# Patient Record
Sex: Female | Born: 1985 | Race: White | Hispanic: No | Marital: Married | Smoking: Never smoker
Health system: Southern US, Community
[De-identification: ages and names within clinical notes are randomized; demographics above are authoritative.]

## PROBLEM LIST (undated history)

## (undated) DIAGNOSIS — Z789 Other specified health status: Secondary | ICD-10-CM

## (undated) HISTORY — PX: SEPTOPLASTY: SUR1290

## (undated) HISTORY — PX: NO PAST SURGERIES: SHX2092

---

## 2019-02-27 DIAGNOSIS — Z01419 Encounter for gynecological examination (general) (routine) without abnormal findings: Secondary | ICD-10-CM | POA: Diagnosis not present

## 2019-02-27 DIAGNOSIS — F419 Anxiety disorder, unspecified: Secondary | ICD-10-CM | POA: Diagnosis not present

## 2019-02-27 DIAGNOSIS — Z0000 Encounter for general adult medical examination without abnormal findings: Secondary | ICD-10-CM | POA: Diagnosis not present

## 2019-02-27 DIAGNOSIS — F329 Major depressive disorder, single episode, unspecified: Secondary | ICD-10-CM | POA: Diagnosis not present

## 2019-04-23 ENCOUNTER — Emergency Department (HOSPITAL_COMMUNITY)
Admission: EM | Admit: 2019-04-23 | Discharge: 2019-04-23 | Disposition: A | Discharge status: Home / Self Care | Payer: 59 | Attending: Emergency Medicine | Admitting: Emergency Medicine

## 2019-04-23 ENCOUNTER — Other Ambulatory Visit: Payer: Self-pay

## 2019-04-23 ENCOUNTER — Emergency Department (HOSPITAL_COMMUNITY): Payer: 59

## 2019-04-23 ENCOUNTER — Encounter (HOSPITAL_COMMUNITY): Payer: Self-pay

## 2019-04-23 DIAGNOSIS — J189 Pneumonia, unspecified organism: Secondary | ICD-10-CM | POA: Insufficient documentation

## 2019-04-23 DIAGNOSIS — N3091 Cystitis, unspecified with hematuria: Secondary | ICD-10-CM | POA: Diagnosis not present

## 2019-04-23 DIAGNOSIS — R918 Other nonspecific abnormal finding of lung field: Secondary | ICD-10-CM | POA: Diagnosis not present

## 2019-04-23 DIAGNOSIS — Z20828 Contact with and (suspected) exposure to other viral communicable diseases: Secondary | ICD-10-CM | POA: Diagnosis not present

## 2019-04-23 DIAGNOSIS — R1111 Vomiting without nausea: Secondary | ICD-10-CM | POA: Diagnosis not present

## 2019-04-23 DIAGNOSIS — N3090 Cystitis, unspecified without hematuria: Secondary | ICD-10-CM | POA: Diagnosis not present

## 2019-04-23 DIAGNOSIS — R319 Hematuria, unspecified: Secondary | ICD-10-CM | POA: Diagnosis present

## 2019-04-23 LAB — URINALYSIS, ROUTINE W REFLEX MICROSCOPIC
Bilirubin Urine: NEGATIVE
Glucose, UA: NEGATIVE mg/dL
Hgb urine dipstick: NEGATIVE
Ketones, ur: 20 mg/dL — AB
Leukocytes,Ua: NEGATIVE
Nitrite: POSITIVE — AB
Protein, ur: NEGATIVE mg/dL
Specific Gravity, Urine: 1.014 (ref 1.005–1.030)
pH: 7 (ref 5.0–8.0)

## 2019-04-23 LAB — PREGNANCY, URINE: Preg Test, Ur: NEGATIVE

## 2019-04-23 MED ORDER — CEFDINIR 300 MG PO CAPS: 300.0000 mg | ORAL_CAPSULE | Freq: Two times a day (BID) | ORAL | 0 refills | Status: AC

## 2019-04-23 NOTE — Discharge Instructions (Addendum)
Take the prescription as directed.  Call your regular medical doctor today to schedule a follow up appointment within the next week.  Return to the Emergency Department immediately sooner if worsening.

## 2019-04-23 NOTE — ED Provider Notes (Signed)
[facility] EMERGENCY DEPARTMENT Provider Note   CSN: [id number] Arrival date & time: 04/23/19  6160     History   Chief Complaint Chief Complaint  Patient presents with   Hematuria    HPI Veronica Harris is a 33 y.o. female.     HPI  Pt was seen at 1025.  Per pt, c/o gradual onset and persistence of constant dysuria and hematuria for the past 2 days. Has been associated with suprapubic "pressure," several episodes of N/V, and bilat lower back "pains."  Denies fevers, no abd pain, no N/V/D, no vaginal bleeding/discharge, no rash.    History reviewed. No pertinent past medical history.  There are no active problems to display for this patient.   History reviewed. No pertinent surgical history.   OB History   No obstetric history on file.      Home Medications    Prior to Admission medications   Not on File    Family History No family history on file.  Social History Social History   Tobacco Use   Smoking status: Never Smoker   Smokeless tobacco: Never Used  Substance Use Topics   Alcohol use: Never    Frequency: Never   Drug use: Never     Allergies   Latex   Review of Systems Review of Systems ROS: Statement: All systems negative except as marked or noted in the HPI; Constitutional: Negative for fever and chills. ; ; Eyes: Negative for eye pain, redness and discharge. ; ; ENMT: Negative for ear pain, hoarseness, nasal congestion, sinus pressure and sore throat. ; ; Cardiovascular: Negative for chest pain, palpitations, diaphoresis, dyspnea and peripheral edema. ; ; Respiratory: Negative for cough, wheezing and stridor. ; ; Gastrointestinal: Negative for nausea, vomiting, diarrhea, abdominal pain, blood in stool, hematemesis, jaundice and rectal bleeding. . ; ; Genitourinary: +dysuria and hematuria. ; ; GYN:  No pelvic pain, no vaginal bleeding, no vaginal discharge, no vulvar pain. ;; Musculoskeletal: +LBP. Negative for neck pain. Negative for  swelling and trauma.; ; Skin: Negative for pruritus, rash, abrasions, blisters, bruising and skin lesion.; ; Neuro: Negative for headache, lightheadedness and neck stiffness. Negative for weakness, altered level of consciousness, altered mental status, extremity weakness, paresthesias, involuntary movement, seizure and syncope.       Physical Exam Updated Vital Signs BP (!) 119/92    Pulse (!) 102    Temp 97.8 F (36.6 C)    Resp 18    LMP 04/12/2019    SpO2 100%   Physical Exam 1030: Physical examination:  Nursing notes reviewed; Vital signs and O2 SAT reviewed;  Constitutional: Well developed, Well nourished, Well hydrated, In no acute distress; Head:  Normocephalic, atraumatic; Eyes: EOMI, PERRL, No scleral icterus; ENMT: Mouth and pharynx normal, Mucous membranes moist; Neck: Supple, Full range of motion, No lymphadenopathy; Cardiovascular: Regular rate and rhythm, No gallop; Respiratory: Breath sounds clear & equal bilaterally, No wheezes.  Speaking full sentences with ease, Normal respiratory effort/excursion; Chest: Nontender, Movement normal; Abdomen: Soft, +mild suprapubic tenderness to palp. No rebound or guarding. Nondistended, Normal bowel sounds; Genitourinary: No CVA tenderness; Spine:  No midline CS, TS, LS tenderness. +mild TTP bilat lumbar paraspinal muscles. No rash.;; Extremities: Peripheral pulses normal, No tenderness, No edema, No calf edema or asymmetry.; Neuro: AA&Ox3, Major CN grossly intact.  Speech clear. No gross focal motor or sensory deficits in extremities.; Skin: Color normal, Warm, Dry.   ED Treatments / Results  Labs (all labs ordered are listed, but  only abnormal results are displayed)   EKG None  Radiology   Procedures Procedures (including critical care time)  Medications Ordered in ED Medications - No data to display   Initial Impression / Assessment and Plan / ED Course  I have reviewed the triage vital signs and the nursing notes.  Pertinent  labs & imaging results that were available during my care of the patient were reviewed by me and considered in my medical decision making (see chart for details).       MDM Reviewed: previous chart, nursing note and vitals Interpretation: labs and CT scan   Results for orders placed or performed during the hospital encounter of 04/23/19  Urinalysis, Routine w reflex microscopic  Result Value Ref Range   Color, Urine AMBER (A) YELLOW   APPearance CLEAR CLEAR   Specific Gravity, Urine 1.014 1.005 - 1.030   pH 7.0 5.0 - 8.0   Glucose, UA NEGATIVE NEGATIVE mg/dL   Hgb urine dipstick NEGATIVE NEGATIVE   Bilirubin Urine NEGATIVE NEGATIVE   Ketones, ur 20 (A) NEGATIVE mg/dL   Protein, ur NEGATIVE NEGATIVE mg/dL   Nitrite POSITIVE (A) NEGATIVE   Leukocytes,Ua NEGATIVE NEGATIVE   RBC / HPF 0-5 0 - 5 RBC/hpf   WBC, UA 0-5 0 - 5 WBC/hpf   Bacteria, UA RARE (A) NONE SEEN   Squamous Epithelial / LPF 0-5 0 - 5   Mucus PRESENT   Pregnancy, urine  Result Value Ref Range   Preg Test, Ur NEGATIVE NEGATIVE   Dg Chest 2 View Result Date: 04/23/2019 CLINICAL DATA:  Abnormality discovered on CT of the abdomen and pelvis EXAM: CHEST - 2 VIEW COMPARISON:  CT abdomen and pelvis acquired the same date. FINDINGS: Left upper lobe opacity best seen on the frontal view with partial silhouetting on the lateral view of the cardiac borders. No signs of pleural effusion. No additional upper lobe abnormality by radiography. Heart size is normal. No acute bone finding. IMPRESSION: Findings that likely represent pneumonitis/pneumonia. Consider follow-up to ensure resolution and exclude underlying lesion. Electronically Signed   By: Zetta Bills M.D.   On: 04/23/2019 12:57   Ct Renal Stone Study Result Date: 04/23/2019 CLINICAL DATA:  Bilateral flank pain worse on the left side since yesterday with nausea, vomiting and hematuria. History of renal stones. EXAM: CT ABDOMEN AND PELVIS WITHOUT CONTRAST TECHNIQUE:  Multidetector CT imaging of the abdomen and pelvis was performed following the standard protocol without IV contrast. COMPARISON:  None. FINDINGS: Lower chest: Lung bases demonstrate airspace consolidation over the lateral lingula incompletely visualized, but likely a pneumonia. No effusion. Hepatobiliary: Liver, gallbladder and biliary tree are normal. Pancreas: Normal. Spleen: Normal. Adrenals/Urinary Tract: Adrenal glands are normal. Kidneys are normal in size with 2 mm stone over the lower pole right kidney and possible punctate stone over the upper pole left kidney. No evidence of hydronephrosis. Ureters and bladder are normal. Stomach/Bowel: Stomach and small bowel are normal. Appendix is normal. Colon is unremarkable. Vascular/Lymphatic: Abdominal aorta is normal in caliber. No adenopathy. Reproductive: Normal. Other: Tiny amount of free pelvic fluid likely physiologic. Musculoskeletal: Normal. IMPRESSION: 1. No acute findings in the abdomen/pelvis. Minimal bilateral nephrolithiasis without obstruction. 2. Incompletely visualized airspace consolidation over the left lateral aspect of the lingula likely a pneumonia. Recommend correlation with chest radiograph. Electronically Signed   By: Marin Olp M.D.   On: 04/23/2019 12:01     KASSIDEE NARCISO was evaluated in Emergency Department on 04/23/2019 for the symptoms described in  the history of present illness. She was evaluated in the context of the global COVID-19 pandemic, which necessitated consideration that the patient might be at risk for infection with the SARS-CoV-2 virus that causes COVID-19. Institutional protocols and algorithms that pertain to the evaluation of patients at risk for COVID-19 are in a state of rapid change based on information released by regulatory bodies including the CDC and federal and state organizations. These policies and algorithms were followed during the patient's care in the ED.   1400:  Tx for UTI and CAP. Dx and  testing, as well as incidental finding(s), d/w pt and family.  Questions answered.  Verb understanding, agreeable to d/c home with outpt f/u.    Final Clinical Impressions(s) / ED Diagnoses   Final diagnoses:  None    ED Discharge Orders    None       Samuel JesterMcManus, Katricia Prehn, DO 04/26/19 [id number]

## 2019-04-23 NOTE — ED Notes (Signed)
EDP at bedside updating pt 

## 2019-04-23 NOTE — ED Triage Notes (Signed)
Pt reports hematuria, n/v, pressure in bladder, and bilateral flank pain.

## 2019-04-23 NOTE — ED Notes (Signed)
Patient transported to CT 

## 2019-04-24 LAB — NOVEL CORONAVIRUS, NAA (HOSP ORDER, SEND-OUT TO REF LAB; TAT 18-24 HRS): SARS-CoV-2, NAA: NOT DETECTED

## 2019-05-02 ENCOUNTER — Other Ambulatory Visit (HOSPITAL_COMMUNITY): Payer: Self-pay | Admitting: Nurse Practitioner

## 2019-05-30 DIAGNOSIS — N6320 Unspecified lump in the left breast, unspecified quadrant: Secondary | ICD-10-CM | POA: Diagnosis not present

## 2019-06-12 DIAGNOSIS — N6321 Unspecified lump in the left breast, upper outer quadrant: Secondary | ICD-10-CM | POA: Diagnosis not present

## 2019-07-16 MED FILL — ESCITALOPRAM 20 MG TABLET: 20 | 90 days supply | Qty: 90 | Fill #0

## 2019-07-16 MED FILL — buPROPion HCL ER (XL) 300 M: 300 | 90 days supply | Qty: 90 | Fill #0

## 2019-08-20 DIAGNOSIS — S161XXA Strain of muscle, fascia and tendon at neck level, initial encounter: Secondary | ICD-10-CM | POA: Diagnosis not present

## 2019-08-20 DIAGNOSIS — Z91040 Latex allergy status: Secondary | ICD-10-CM | POA: Diagnosis not present

## 2019-08-20 DIAGNOSIS — X58XXXA Exposure to other specified factors, initial encounter: Secondary | ICD-10-CM | POA: Diagnosis not present

## 2019-08-20 DIAGNOSIS — Z79899 Other long term (current) drug therapy: Secondary | ICD-10-CM | POA: Diagnosis not present

## 2019-08-20 DIAGNOSIS — Z888 Allergy status to other drugs, medicaments and biological substances status: Secondary | ICD-10-CM | POA: Diagnosis not present

## 2020-03-31 ENCOUNTER — Other Ambulatory Visit (HOSPITAL_COMMUNITY): Payer: Self-pay | Admitting: Nurse Practitioner

## 2021-01-06 IMAGING — DX DG CHEST 2V
2 series · 2 of 2 positions shown · non-contrast
Comparison: CT abdomen and pelvis acquired the same date.

CLINICAL DATA: Abnormality discovered on CT of the abdomen and
pelvis

EXAM:
CHEST - 2 VIEW

[chest pa]
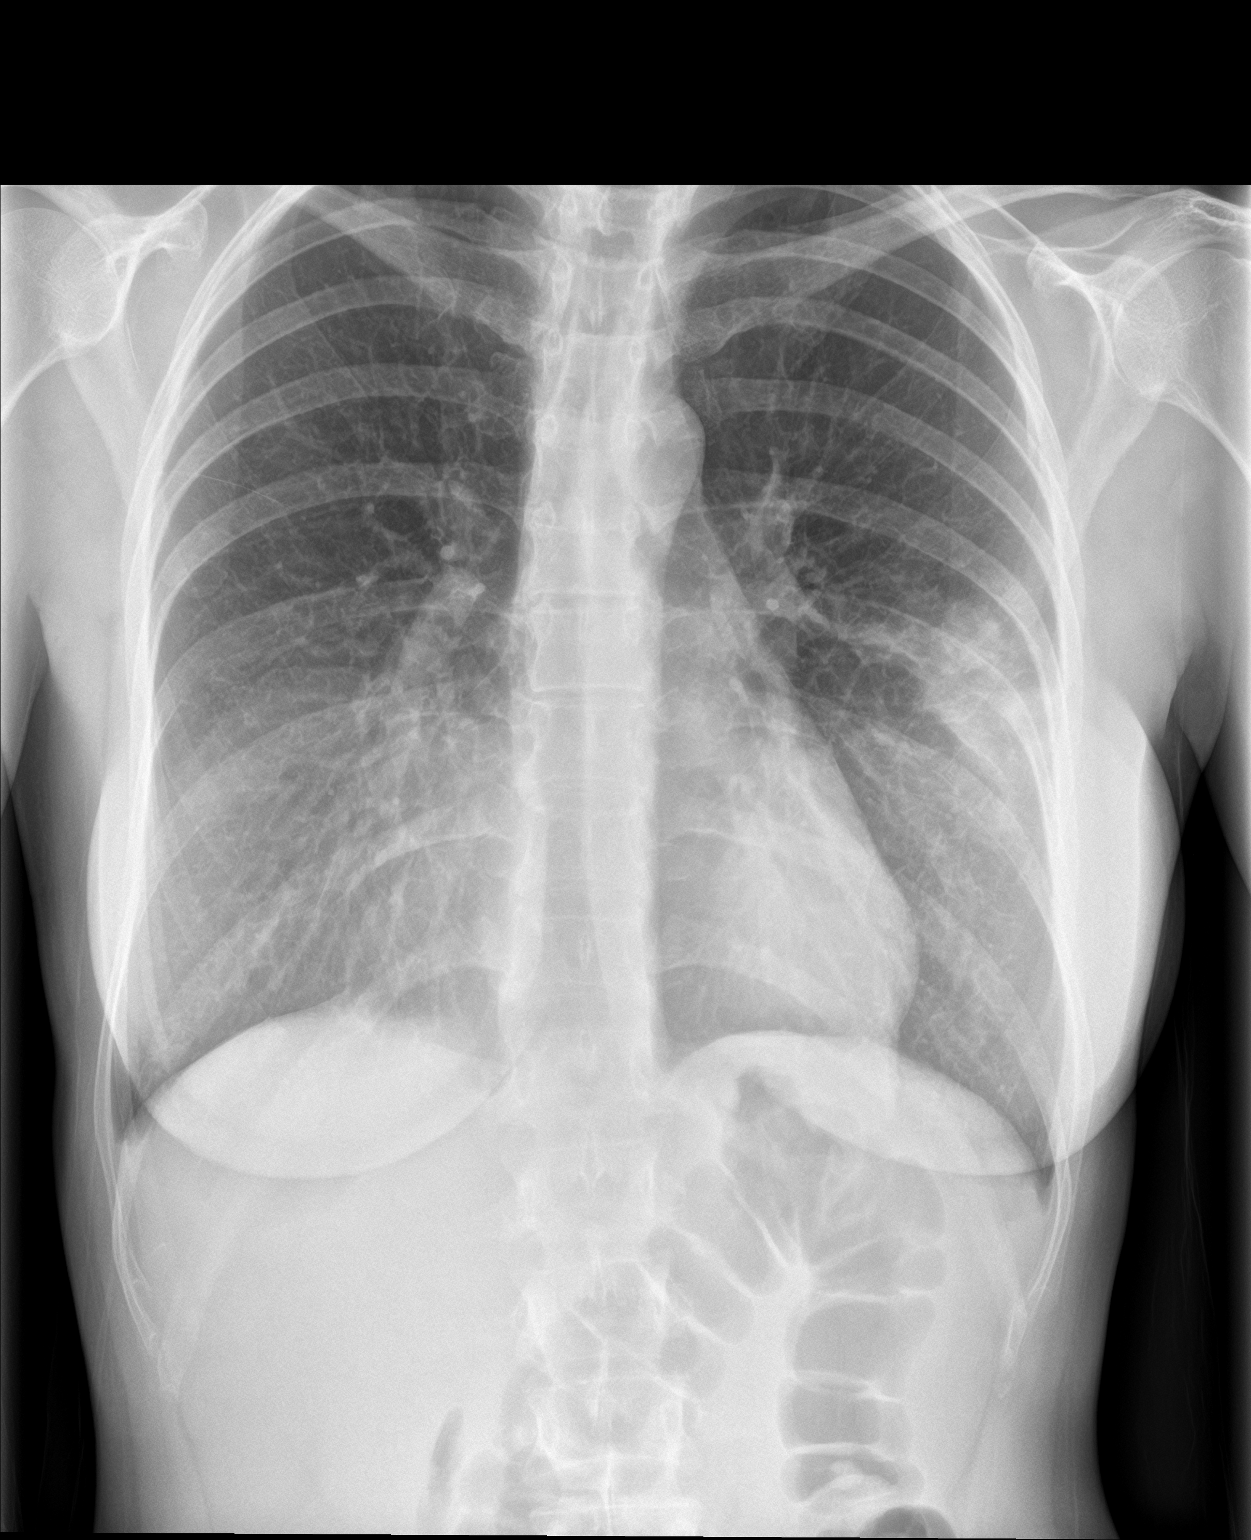

[chest lat]
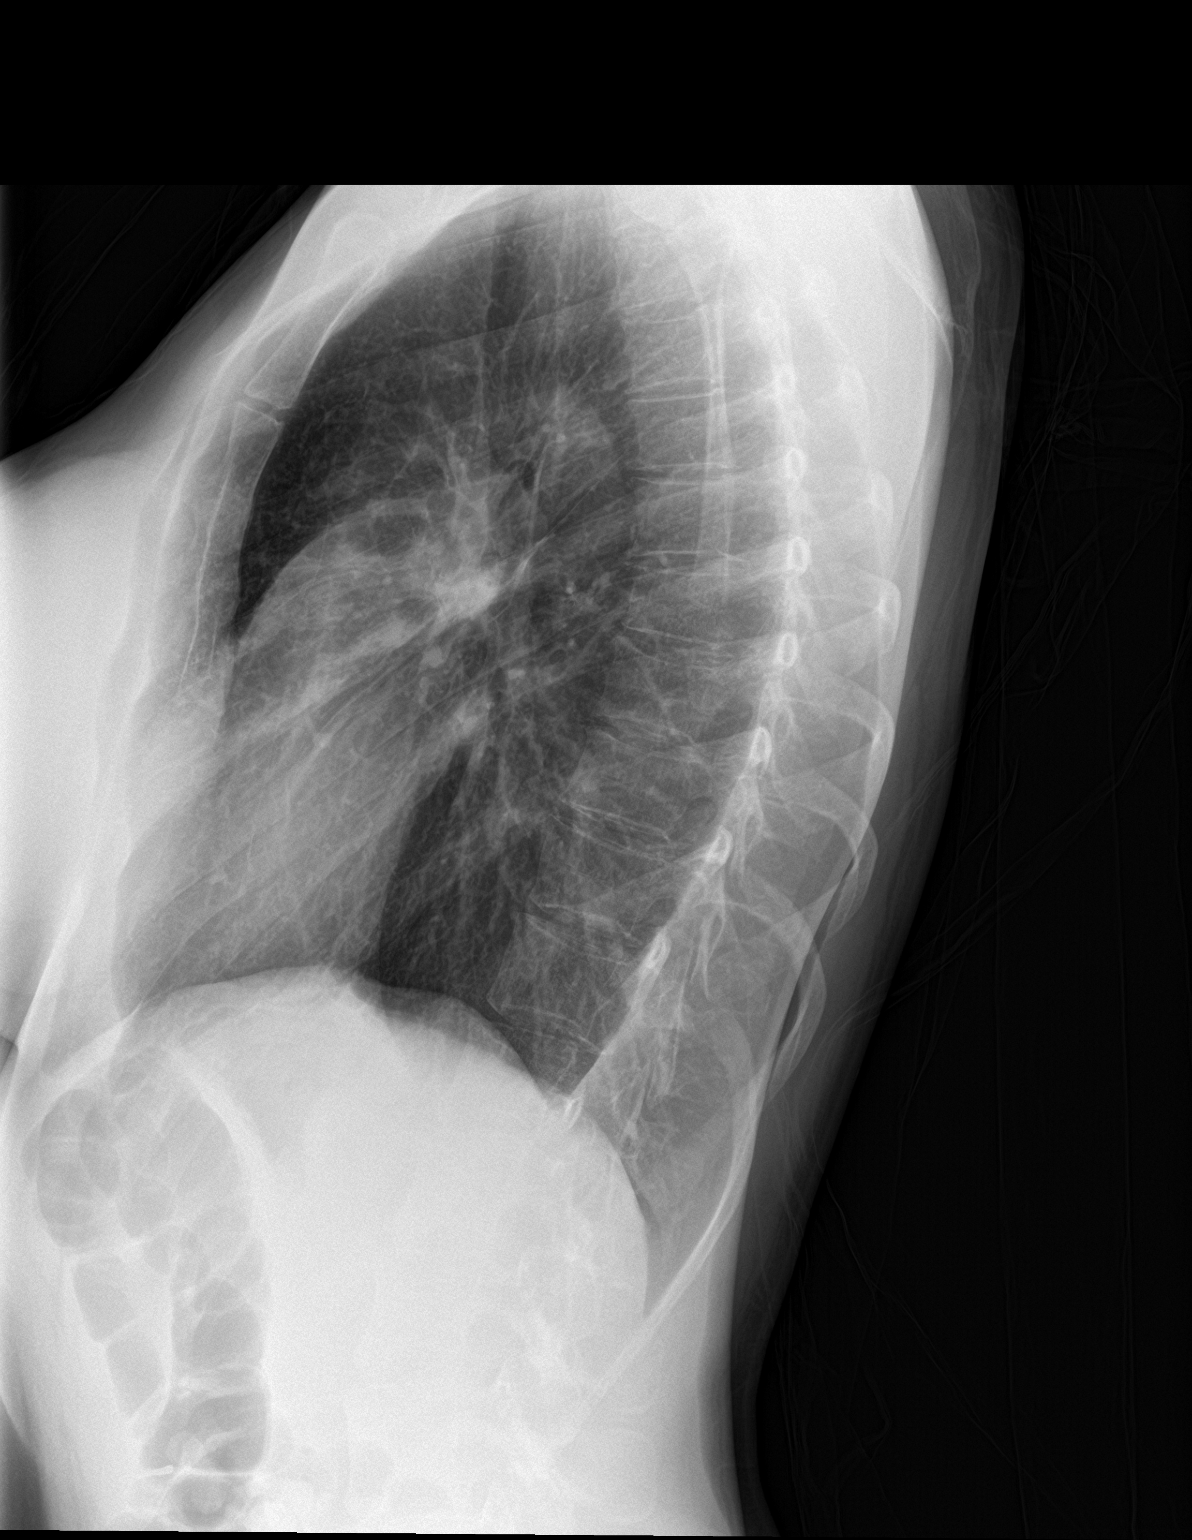

[2 of 2 positions shown; findings below may reference images not displayed]

FINDINGS: Left upper lobe opacity best seen on the frontal view with partial
silhouetting on the lateral view of the cardiac borders. No signs of
pleural effusion. No additional upper lobe abnormality by
radiography.

Heart size is normal.

No acute bone finding.
IMPRESSION: Findings that likely represent pneumonitis/pneumonia. Consider
follow-up to ensure resolution and exclude underlying lesion.

## 2021-01-06 IMAGING — CT CT RENAL STONE PROTOCOL
2 of 4 series · 16 of 46 positions shown, 18 images · non-contrast
Comparison: None.

CLINICAL DATA: Bilateral flank pain worse on the left side since
yesterday with nausea, vomiting and hematuria. History of renal
stones.

EXAM:
CT ABDOMEN AND PELVIS WITHOUT CONTRAST
TECHNIQUE: Multidetector CT imaging of the abdomen and pelvis was performed
following the standard protocol without IV contrast.

[Series 2: axial st · axial · 0.58mm/px · z∈[+790,+1195]mm · 13 of 91 slices shown, 15 images]
[im 5/91  soft-tissue]
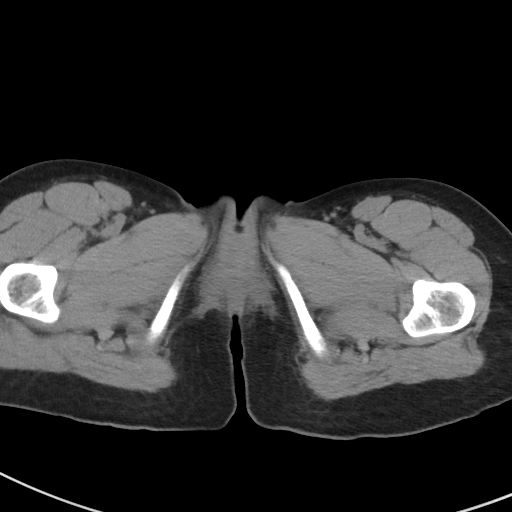
[im 5/91  bone]
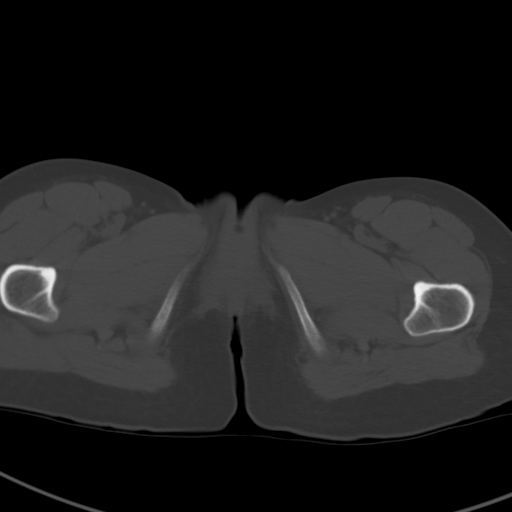
[im 14/91  soft-tissue]
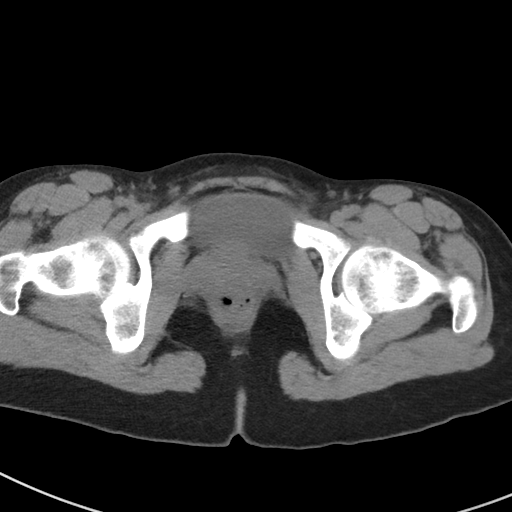
[im 19/91  soft-tissue]
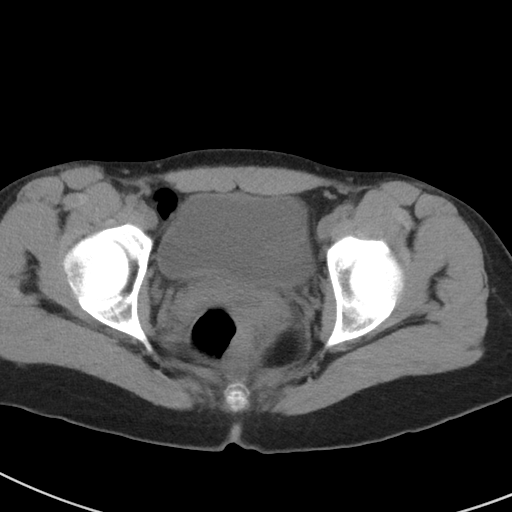
[im 28/91  soft-tissue]
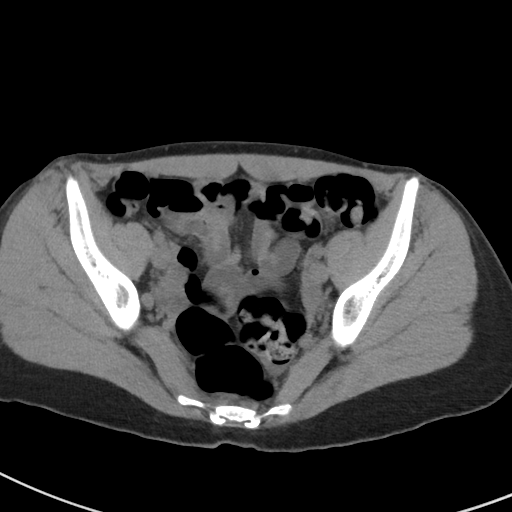
[im 32/91  soft-tissue]
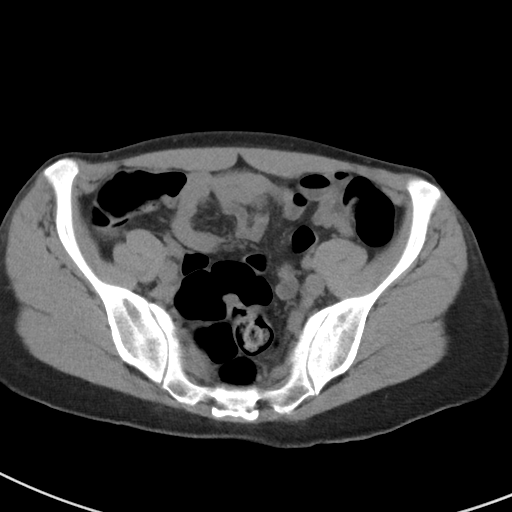
[im 41/91  soft-tissue]
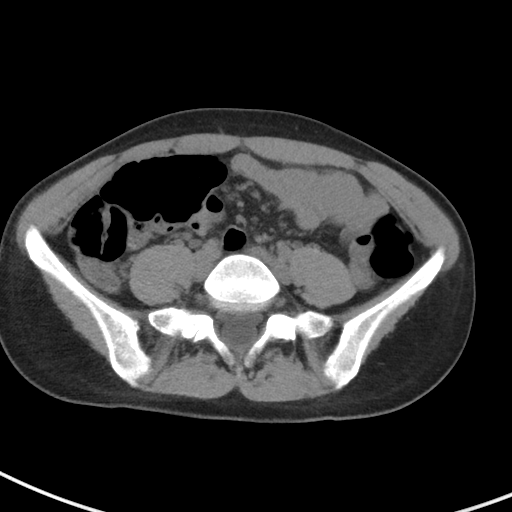
[im 46/91  soft-tissue]
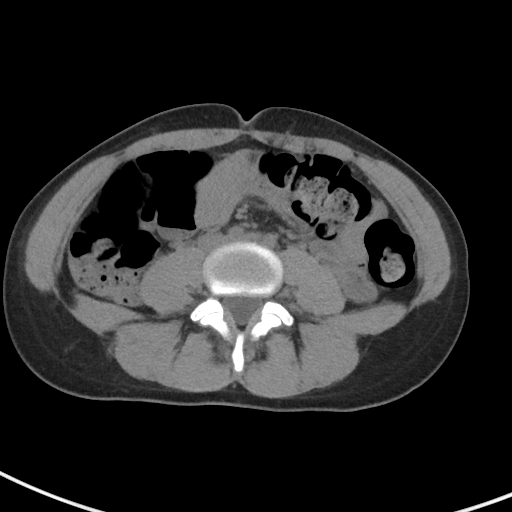
[im 50/91  soft-tissue]
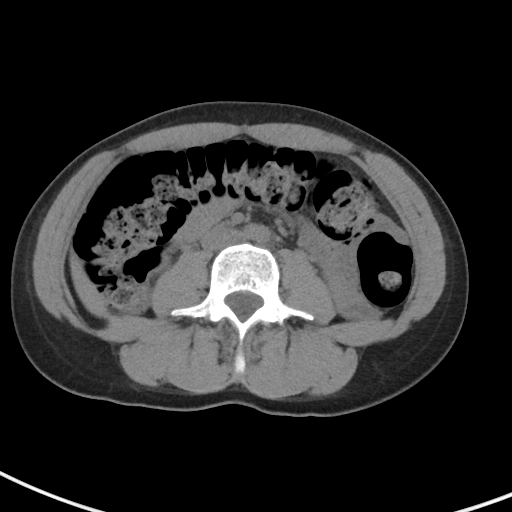
[im 59/91  soft-tissue]
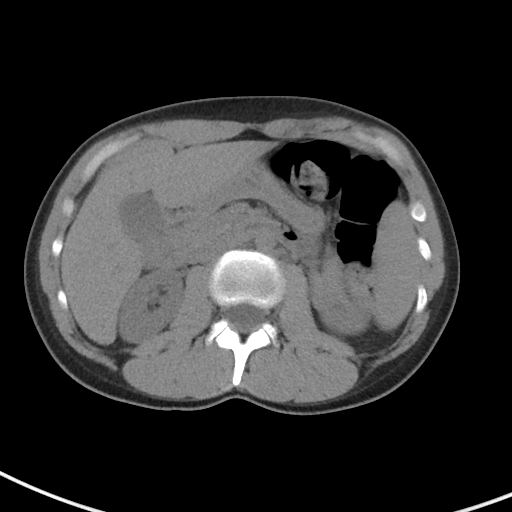
[im 59/91  bone]
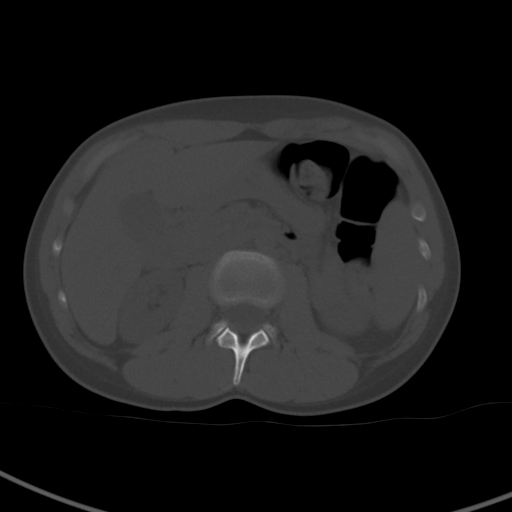
[im 64/91  soft-tissue]
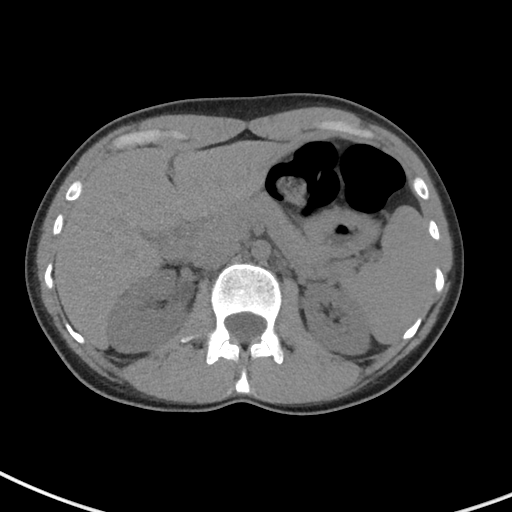
[im 73/91  soft-tissue]
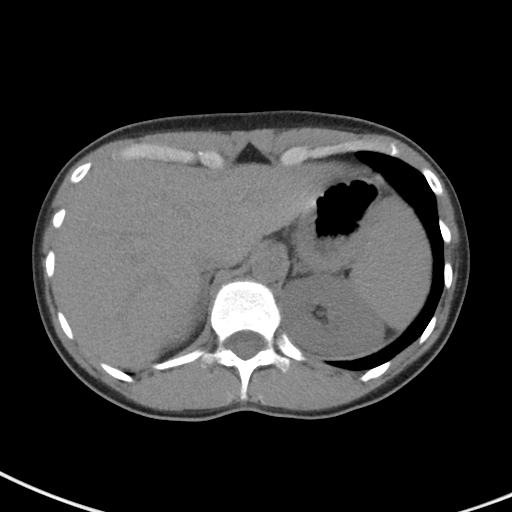
[im 77/91  soft-tissue]
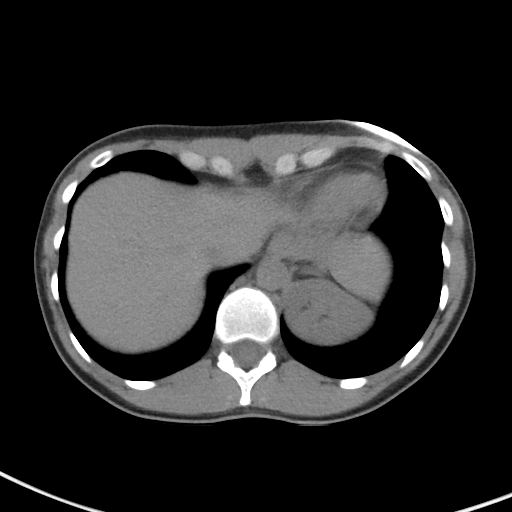
[im 86/91  soft-tissue]
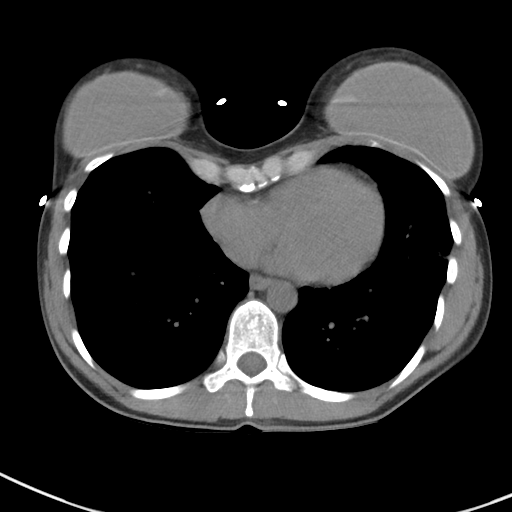

[Series 5: coronal st · coronal · 0.61mm/px · 3 of 88 slices shown]
[im 30/88  soft-tissue]
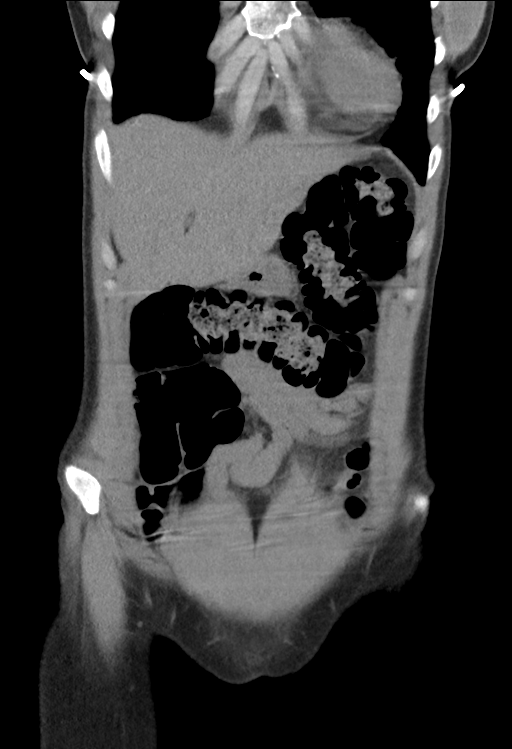
[im 39/88  soft-tissue]
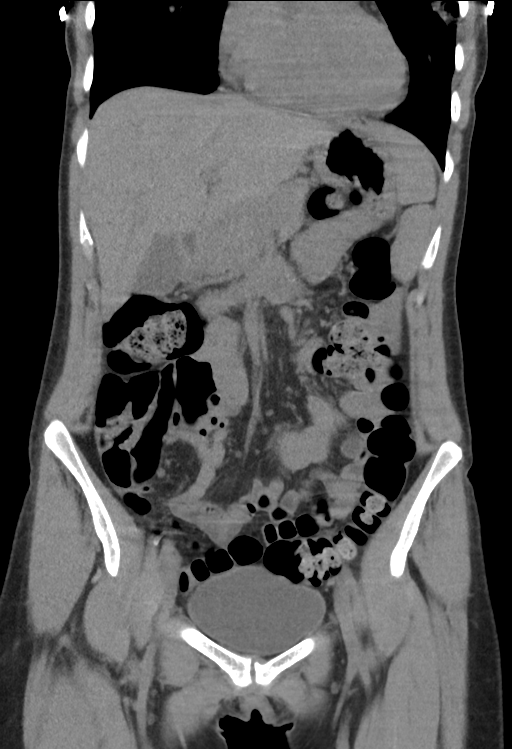
[im 49/88  soft-tissue]
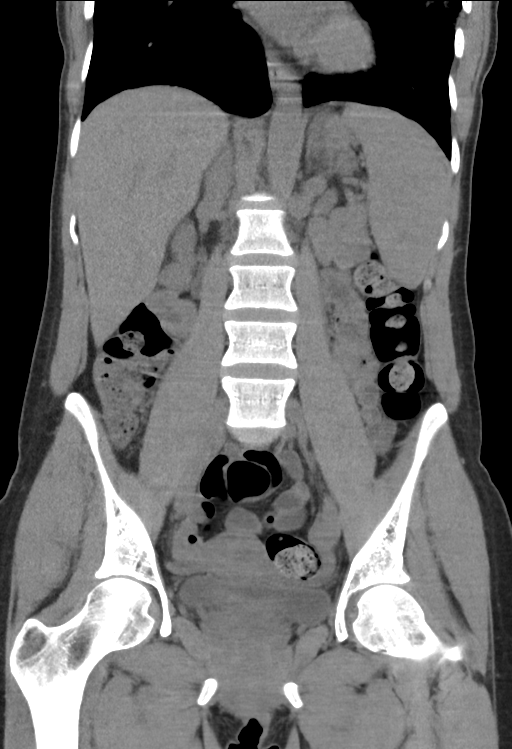

[16 of 46 positions shown; findings below may reference images not displayed]

FINDINGS: Lower chest: Lung bases demonstrate airspace consolidation over the
lateral lingula incompletely visualized, but likely a pneumonia. No
effusion.

Hepatobiliary: Liver, gallbladder and biliary tree are normal.

Pancreas: Normal.

Spleen: Normal.

Adrenals/Urinary Tract: Adrenal glands are normal. Kidneys are
normal in size with 2 mm stone over the lower pole right kidney and
possible punctate stone over the upper pole left kidney. No evidence
of hydronephrosis. Ureters and bladder are normal.

Stomach/Bowel: Stomach and small bowel are normal. Appendix is
normal. Colon is unremarkable.

Vascular/Lymphatic: Abdominal aorta is normal in caliber. No
adenopathy.

Reproductive: Normal.

Other: Tiny amount of free pelvic fluid likely physiologic.

Musculoskeletal: Normal.
IMPRESSION: 1. No acute findings in the abdomen/pelvis. Minimal bilateral
nephrolithiasis without obstruction.

2. Incompletely visualized airspace consolidation over the left
lateral aspect of the lingula likely a pneumonia. Recommend
correlation with chest radiograph.

## 2021-07-18 NOTE — L&D Delivery Note (Signed)
Delivery Note Patient pushed well for 35 minutes.  At 12:44 AM a viable female was delivered via Vaginal, Spontaneous (Presentation: Right Occiput Anterior).  APGAR: 8, 9; weight  3300 gm (7 lb 4.4 oz).   Placenta status: Spontaneous, Intact.  Cord: 3 vessels with the following complications: None.  Cord pH: n/a  Following placental delivery, there was a small thin trailing membrane that appeared to break.  A uterine sweep was performed and removed an approximately 2 cm piece of membrane.  Additional sweep x 2 revealed a firm contracted uterus without any residual membrane.  Bleeding was scant following delivery  Anesthesia: Epidural Episiotomy: None Lacerations: None Suture Repair:  n/a Est. Blood Loss (mL): 50  Mom to postpartum.  Baby to Couplet care / Skin to Skin.  [place] 05/14/2022, 1:12 AM

## 2021-10-29 LAB — OB RESULTS CONSOLE GC/CHLAMYDIA
Chlamydia: NEGATIVE
Neisseria Gonorrhea: NEGATIVE

## 2021-11-05 LAB — OB RESULTS CONSOLE ANTIBODY SCREEN: Antibody Screen: NEGATIVE

## 2021-11-05 LAB — HEPATITIS C ANTIBODY: HCV Ab: NEGATIVE

## 2021-11-05 LAB — OB RESULTS CONSOLE ABO/RH: RH Type: POSITIVE

## 2021-11-05 LAB — OB RESULTS CONSOLE RPR: RPR: NONREACTIVE

## 2021-11-05 LAB — OB RESULTS CONSOLE HIV ANTIBODY (ROUTINE TESTING): HIV: NONREACTIVE

## 2021-11-05 LAB — OB RESULTS CONSOLE HEPATITIS B SURFACE ANTIGEN: Hepatitis B Surface Ag: NEGATIVE

## 2021-11-05 LAB — OB RESULTS CONSOLE RUBELLA ANTIBODY, IGM: Rubella: IMMUNE

## 2022-03-21 ENCOUNTER — Observation Stay (HOSPITAL_COMMUNITY)
Admission: AD | Admit: 2022-03-21 | Discharge: 2022-03-23 | Disposition: A | Discharge status: Home / Self Care | Payer: BLUE CROSS/BLUE SHIELD | Attending: Obstetrics and Gynecology | Admitting: Obstetrics and Gynecology

## 2022-03-21 ENCOUNTER — Encounter (HOSPITAL_COMMUNITY): Payer: Self-pay

## 2022-03-21 ENCOUNTER — Other Ambulatory Visit: Payer: Self-pay

## 2022-03-21 ENCOUNTER — Inpatient Hospital Stay (HOSPITAL_BASED_OUTPATIENT_CLINIC_OR_DEPARTMENT_OTHER): Payer: BLUE CROSS/BLUE SHIELD

## 2022-03-21 DIAGNOSIS — Z3A30 30 weeks gestation of pregnancy: Secondary | ICD-10-CM | POA: Insufficient documentation

## 2022-03-21 DIAGNOSIS — O09523 Supervision of elderly multigravida, third trimester: Secondary | ICD-10-CM | POA: Insufficient documentation

## 2022-03-21 DIAGNOSIS — O4700 False labor before 37 completed weeks of gestation, unspecified trimester: Secondary | ICD-10-CM | POA: Diagnosis not present

## 2022-03-21 DIAGNOSIS — Z3A31 31 weeks gestation of pregnancy: Secondary | ICD-10-CM | POA: Diagnosis not present

## 2022-03-21 HISTORY — DX: Other specified health status: Z78.9

## 2022-03-21 LAB — TYPE AND SCREEN
ABO/RH(D): O POS
Antibody Screen: NEGATIVE

## 2022-03-21 LAB — CBC
HCT: 31.6 % — ABNORMAL LOW (ref 36.0–46.0)
Hemoglobin: 10.7 g/dL — ABNORMAL LOW (ref 12.0–15.0)
MCH: 34.6 pg — ABNORMAL HIGH (ref 26.0–34.0)
MCHC: 33.9 g/dL (ref 30.0–36.0)
MCV: 102.3 fL — ABNORMAL HIGH (ref 80.0–100.0)
Platelets: 242 10*3/uL (ref 150–400)
RBC: 3.09 MIL/uL — ABNORMAL LOW (ref 3.87–5.11)
RDW: 12.5 % (ref 11.5–15.5)
WBC: 18.5 10*3/uL — ABNORMAL HIGH (ref 4.0–10.5)
nRBC: 0 % (ref 0.0–0.2)

## 2022-03-21 MED ORDER — INDOMETHACIN 25 MG PO CAPS
25.0000 mg | ORAL_CAPSULE | Freq: Four times a day (QID) | ORAL | Status: AC
  Administered 2022-03-21 – 2022-03-23 (×8): 25 mg via ORAL
  Filled 2022-03-21 (×9): qty 1

## 2022-03-21 MED ORDER — LACTATED RINGERS IV SOLN: INTRAVENOUS | Status: DC

## 2022-03-21 MED ORDER — ACETAMINOPHEN 325 MG PO TABS: 650.0000 mg | ORAL_TABLET | ORAL | Status: DC | PRN

## 2022-03-21 MED ORDER — DOCUSATE SODIUM 100 MG PO CAPS
100.0000 mg | ORAL_CAPSULE | Freq: Every day | ORAL | Status: DC
  Administered 2022-03-22: 100 mg via ORAL
  Filled 2022-03-21 (×3): qty 1

## 2022-03-21 MED ORDER — PRENATAL MULTIVITAMIN CH
1.0000 | ORAL_TABLET | Freq: Every day | ORAL | Status: DC
  Filled 2022-03-21 (×2): qty 1

## 2022-03-21 MED ORDER — ORAL CARE MOUTH RINSE: 15.0000 mL | OROMUCOSAL | Status: DC | PRN

## 2022-03-21 MED ORDER — CALCIUM CARBONATE ANTACID 500 MG PO CHEW
2.0000 | CHEWABLE_TABLET | ORAL | Status: DC | PRN
  Administered 2022-03-22 – 2022-03-23 (×3): 400 mg via ORAL
  Filled 2022-03-21 (×3): qty 2

## 2022-03-21 MED ORDER — MAGNESIUM SULFATE 40 GM/1000ML IV SOLN
2.0000 g/h | INTRAVENOUS | Status: AC
  Administered 2022-03-21 (×2): 2 g/h via INTRAVENOUS
  Filled 2022-03-21: qty 1000

## 2022-03-21 MED ORDER — ZOLPIDEM TARTRATE 5 MG PO TABS: 5.0000 mg | ORAL_TABLET | Freq: Every evening | ORAL | Status: DC | PRN

## 2022-03-21 MED ORDER — BETAMETHASONE SOD PHOS & ACET 6 (3-3) MG/ML IJ SUSP
12.0000 mg | Freq: Once | INTRAMUSCULAR | Status: AC
  Administered 2022-03-22: 12 mg via INTRAMUSCULAR
  Filled 2022-03-21: qty 5

## 2022-03-21 NOTE — MAU Provider Note (Signed)
History     CSN: [id number]  Arrival date and time: 03/21/22 1029   Event Date/Time   First Provider Initiated Contact with Patient 03/21/22 1101      Chief Complaint  Patient presents with   Contractions   HPI Patient Veronica Harris is a 36 y.o. G2P1001  At [redacted]w[redacted]d here after transfer from [facility]. Patient was seen and evaluated in [facility] ED for threatened Pre-term labor; she received BMZ, LR, Mag bolus started, indomethacin and had vaginal cultures collected. FFN was negative. She progressed from 1 cm last night to 2 cm overnight. She was transferred here for further evaluation bc her OB has privileges at [facility].   Patient states that her contractions are between 5-7; they palpate hard. She denies fever, SOB, chest pain, dysuria and other other OB-GYN complaint. No history of c/section or PT delivery.  OB History     Gravida  2   Para  1   Term  1   Preterm      AB      Living  1      SAB      IAB      Ectopic      Multiple      Live Births  1           Past Medical History:  Diagnosis Date   Medical history non-contributory     Past Surgical History:  Procedure Laterality Date   NO PAST SURGERIES     SEPTOPLASTY      History reviewed. No pertinent family history.  Social History   Tobacco Use   Smoking status: Never   Smokeless tobacco: Never  Vaping Use   Vaping Use: Never used  Substance Use Topics   Alcohol use: Never   Drug use: Never    Allergies:  Allergies  Allergen Reactions   Latex     Medications Prior to Admission  Medication Sig Dispense Refill Last Dose   ALPRAZolam (XANAX) 0.5 MG tablet Take 1 tablet by mouth at bedtime as needed and may repeat dose one time if needed.      buPROPion (WELLBUTRIN XL) 300 MG 24 hr tablet Take 1 tablet by mouth daily.      escitalopram (LEXAPRO) 20 MG tablet Take 20 mg by mouth daily.       Review of Systems  Constitutional: Negative.   HENT: Negative.    Respiratory:  Negative.    Cardiovascular: Negative.   Gastrointestinal:  Positive for abdominal pain.  Genitourinary: Negative.   Musculoskeletal: Negative.   Neurological: Negative.   Psychiatric/Behavioral: Negative.     Physical Exam   Blood pressure 110/65, pulse (!) 101, temperature 97.8 F (36.6 C), temperature source Oral, resp. rate 19, height 5\' 5"  (1.651 m), weight 61.2 kg, SpO2 99 %.  Physical Exam Constitutional:      Appearance: Normal appearance.  Cardiovascular:     Rate and Rhythm: Normal rate.  Pulmonary:     Effort: Pulmonary effort is normal.  Genitourinary:    General: Normal vulva.  Musculoskeletal:        General: Normal range of motion.  Skin:    General: Skin is warm.  Neurological:     General: No focal deficit present.     Mental Status: She is alert.  Psychiatric:     Comments: Appears distressed    Cervix is 2.5/middle position  MAU Course  Procedures  MDM  10:45 am: I was immediately present at the  bedside to assess patient; abdomen palpates hard with contractions and patient appears distressed. She has no LOF, VB and heartrate is reassuring.   11:18 am-Dr. Reina Fuse aware of patient's presence on the unit and is at the bedside.  Assessment and Plan  Dr. Reina Fuse assumes care of this patient at 54.  Veronica Harris 03/21/2022, 12:44 PM

## 2022-03-21 NOTE — Progress Notes (Signed)
Patient resting in bed, appears calm. Easily awakens, feeling much improved. Endorsing much more spaced out contractions. +FM, denies VB, LOF. Tolerating light laboring meal well without N/V  BP 103/61   Pulse 90   Temp 98.5 F (36.9 C) (Oral)   Resp 16   Ht 5\' 5"  (1.651 m)   Wt 61.2 kg Comment: per [place]  SpO2 99%   BMI 22.47 kg/m Cat 1 tracing, TOCO irr now, q15-59m acitvity  UrCx and in-house CBC pending, T&S Opos  Continue continuous monitoring while on MgSO4 for CP ppx, total UOP 2L. BMTZ at 0300, same time as Mag DC Indomethacin 25mg  q6hr PO for total of 8 doses May have regular diet at this time

## 2022-03-21 NOTE — MAU Note (Addendum)
...  Veronica Harris is a 37 y.o. at [redacted]w[redacted]d here in MAU reporting: CTX for the past two days that worsened over night around 2100. She reports she went to [facility] in [place], [place] around 2300 last night. She reports she received three bags of LR, IV morphine x1, Terbutaline, 4g Mag Sulfate bolus (around 0300 this morning), and two doses of Indomethacin (50 mg and then 25 mg, the 25 mg she received before transport with [facility]. She also received IVPB of Pepcid and Zofran IV push right before transport as well. Denies VB or LOF. +FM.  Negative FFN at [facility]. WBC 13. Hgb 11. Platelet's normal. All per patient.   Urine sent for culture. GC and Wet Prep collected. + clue cells. GBS done.  Last cervical exam before transport and was 2 cm and thick.  Patient has a foley catheter placed that was placed around 0300 this morning. 600 cc drained on arrival. AxO x4. Patellar reflexes plus 2.  [facility]. Next appointment 9/19. Onset of complaint: Two days ago  Dx two weeks ago with BV and yeast. Received Flagyl and had vaginal creams. Treatment complete.   Pain score:  2/10 HA 8/10 abdomen  Vitals:   03/21/22 1030  BP: 133/81  Resp: 19  Temp: 97.8 F (36.6 C)  SpO2: 100%     FHT: 135 initial external

## 2022-03-21 NOTE — H&P (Addendum)
Veronica Harris is a 36 y.o. female presenting as transfer from [place], [place]. Patient's husband called with complaints of severe abdominal pain appearing pale. Reported to [facility] in [place] with cervical change was made from 1 to 2cm over 7hrs. Currently endorsing contractions q5-41m, notes +FM, denies frank VB or LOF. Patient denies recent abdominal trauma/fall, strenuous activity, intercourse/exercise. No recent illness, was napping when contractions came on overnight. Denies vomting, notes some nausea with contractions, denies fevers and chills.   PNC c/b AMA with low risk NIPT and h/o term SVD x1 in 2018 (h/o LEEP in '09) OB History     Gravida  2   Para  1   Term  1   Preterm      AB      Living  1      SAB      IAB      Ectopic      Multiple      Live Births  1          Past Medical History:  Diagnosis Date   Medical history non-contributory    Past Surgical History:  Procedure Laterality Date   NO PAST SURGERIES     SEPTOPLASTY     Family History: family history is not on file. Social History:  reports that she has never smoked. She has never used smokeless tobacco. She reports that she does not drink alcohol and does not use drugs.     Maternal Diabetes: No1hr 11 Genetic Screening: Normal Maternal Ultrasounds/Referrals: Normal Fetal Ultrasounds or other Referrals:  None Maternal Substance Abuse:  No Significant Maternal Medications:  None Significant Maternal Lab Results:  None Number of Prenatal Visits:greater than 3 verified prenatal visits Other Comments:  None  Review of Systems  Constitutional:  Negative for chills and fever.  Respiratory:  Negative for shortness of breath.   Cardiovascular:  Negative for chest pain, palpitations and leg swelling.  Gastrointestinal:  Negative for abdominal pain and vomiting.  Neurological:  Negative for dizziness, weakness and headaches.  Psychiatric/Behavioral:  Negative for suicidal ideas.    Maternal  Medical History:  Reason for admission: Contractions.   Contractions: Onset was 6-12 hours ago.   Frequency: regular.   Perceived severity is moderate.   Fetal activity: Perceived fetal activity is normal.   Prenatal complications: Preterm labor.   No PIH or oligohydramnios.   Prenatal Complications - Diabetes: none.   Dilation: 2.5 Exam by:: Dr. Reina Fuse Blood pressure 110/65, pulse (!) 101, temperature 97.8 F (36.6 C), temperature source Oral, resp. rate 19, height 5\' 5"  (1.651 m), weight 61.2 kg, SpO2 99 %. Exam Physical Exam Constitutional:      General: She is not in acute distress.    Appearance: She is well-developed.  HENT:     Head: Normocephalic and atraumatic.  Eyes:     Pupils: Pupils are equal, round, and reactive to light.  Cardiovascular:     Rate and Rhythm: Normal rate and regular rhythm.     Heart sounds: No murmur heard.    No gallop.  Abdominal:     Tenderness: There is no abdominal tenderness. There is no guarding or rebound.  Genitourinary:    Vagina: Normal.  Musculoskeletal:        General: Normal range of motion.     Cervical back: Normal range of motion and neck supple.  Skin:    General: Skin is warm and dry.  Neurological:     Mental Status: She is  alert and oriented to person, place, and time.     Prenatal labs: ABO, Rh:  oPOS Antibody:  neg Rubella:  imm RPR:   nr HBsAg:   neg HIV:   nr GBS:   pending  Outside labs: FFN neg, Gc/Ch neg, WBC 13.8, 11.5/33.9/254 Currently category 1 tracing  Assessment/Plan: This is a 35yo G2P1001 @ 30 4/7 by LMP c/w 10wk TVUS admitted with preterm contractions. CE has been relatively stable since 0530 (2/60/-3, now 2.5/60/-3 here stable over 2hrs). Cat 1 tracing, appropriate for gestational age.  S/p MFM scan given preterm infant. Vtx, anterior placenta, AFI 20, EFW 2214g/4lb12oz/>99th%tile/ Movement, tone andbreathing all noted though formal BPP was not ordered At this time, TOCO q3-43m, still  palpating moderately at bedside. Abdomen is appropriately tender during contraction but otherwise soft and NTTP. BSUS c/w [place] (vtx).  -Admit to [facility]SC at this time for rule out PTL. Will order NICU consult given EGA. -BMZ and MgSO4 24hr are both at 0300 tonight. Foley catheter in place from OSH, please continue -Repeat CBC, T&S and CMP here. Urine culture ordered  Carlisle Cater 03/21/2022, 12:58 PM

## 2022-03-21 NOTE — Progress Notes (Signed)
Report received from V. Sherrie Mustache, RN.  Pt care assumed by this RN.

## 2022-03-21 NOTE — MAU Note (Addendum)
Per [facility] records brought by [facility]:  CBC, Urine sent for culture, GC by PCR (negative) and Wet Prep (+clue cells). GBS collected.  0000 1thick and posterior. 0130 170 bloody show. 0530 260-3 bloody show.  0020 IV with LR bolus. 0031 Zofran 4mg  and Morphine 10 IM. 0050 2nd Bag LR. 0148 0.25 mg SQ Terb L arm 0240 4g Mag Bolus 0300 Mag 1g continuous 0311 BMZ L Hip 0402 Indomethacin 50 mg PO 0545 Mag increased to 2g continuous 0605 Pepcid 20 mg IV  20 gauge IV left wrist. 16 french latex free indwelling foley catheter.

## 2022-03-21 NOTE — Progress Notes (Signed)
FHT dopplered during bedside U/S, tech switching out probes

## 2022-03-21 NOTE — Progress Notes (Signed)
Patient on unit and requesting to eat.  Phoned Dr. Reina Fuse with request.  See order for Light Labor Diet.

## 2022-03-21 NOTE — Consult Note (Signed)
Neonatology Consult  Note:  At the request of the patients obstetrician Dr. Wilhelmenia Blase I met with Roger Shelter and her husband.   She is a 36yo G2P1001 @ 30 4/7 by LMP c/w 10wk TVUS admitted with preterm contractions. Current management includes admission to [facility] to rule out PTL, BMZ and MgSO4 24hr.  We reviewed initial delivery room management, including CPAP, [place], and low but certainly possible need for intubation for surfactant administration.  We discussed feeding immaturity and need for full po intake with multiple days of good weight gain and no apnea or bradycardia before discharge.  We reviewed increased risk of jaundice, infection, and temperature instability.   Discussed likely length of stay.  Thank you for allowing [place] to participate in her care.  Please call with questions.  Higinio Roger, DO  Neonatologist  The total length of face-to-face or floor / unit time for this encounter was 35 minutes.  Counseling and / or coordination of care was greater than fifty percent of the time.

## 2022-03-22 LAB — URINE CULTURE: Culture: NO GROWTH

## 2022-03-22 NOTE — Progress Notes (Signed)
36 y.o. G2P1001 [redacted]w[redacted]d HD#1 admitted for preterm labor  She reports feeling much better today.  Magnesium was discontinued overnight at 24 hours.  Contractions at this time are minimal and mild.  Denies bleeding or LOF.  Endorses good FM   Vitals:   03/22/22 0314 03/22/22 0400 03/22/22 0500 03/22/22 0702  BP: (!) 101/59   (!) 90/54  Pulse: 92   91  Resp: 16 16 18 16   Temp:      TempSrc:      SpO2: 98%     Weight:      Height:        NAD Unlabored respirations Abd  Soft, gravid, nontender Ex no edema FHTs  130s, moderate variability, no decelertions, + accelerations  Toco  quiet  Results for orders placed or performed during the hospital encounter of 03/21/22 (from the past 24 hour(s))  Type and screen [facility]     Status: None   Collection Time: 03/21/22  1:47 PM  Result Value Ref Range   ABO/RH(D) O POS    Antibody Screen NEG    Sample Expiration      03/24/2022,2359 Performed at [facility], [address] [place], [place]   CBC     Status: Abnormal   Collection Time: 03/21/22  1:47 PM  Result Value Ref Range   WBC 18.5 (H) 4.0 - 10.5 K/uL   RBC 3.09 (L) 3.87 - 5.11 MIL/uL   Hemoglobin 10.7 (L) 12.0 - 15.0 g/dL   HCT 05/21/22 (L) 14.4 - 81.8 %   MCV 102.3 (H) 80.0 - 100.0 fL   MCH 34.6 (H) 26.0 - 34.0 pg   MCHC 33.9 30.0 - 36.0 g/dL   RDW 56.3 14.9 - 70.2 %   Platelets 242 150 - 400 K/uL   nRBC 0.0 0.0 - 0.2 %    A:  G2P1001 @ [redacted]w[redacted]d with preterm labor  P: --Continue expectant management as inpatient --PTL:  Last SVE was stable at 2.5/60/-3.  S/p 24 hours of magnesium sulfate.  Was started on a 48 hour course of indocin prior to transfer, will complete overnight.  Will switch to BID NST as magnesium is now complete --Prematurity:  s/p BMZ on 9/4 and 9/5 at 0310, NICU consult on 9/4.  11/4 9/4: EFW : 2214g/4lb12oz/>99th%tile, AFI 20, vertex --Urine culture pending but asymptomatic  Wagner Tanzi GEFFEL Gal Smolinski

## 2022-03-23 ENCOUNTER — Inpatient Hospital Stay (EMERGENCY_DEPARTMENT_HOSPITAL)
Admission: AD | Admit: 2022-03-23 | Discharge: 2022-03-24 | Disposition: A | Discharge status: Home / Self Care | Payer: BLUE CROSS/BLUE SHIELD | Source: Home / Self Care | Attending: Obstetrics and Gynecology | Admitting: Obstetrics and Gynecology

## 2022-03-23 ENCOUNTER — Encounter (HOSPITAL_COMMUNITY): Payer: Self-pay | Admitting: Obstetrics and Gynecology

## 2022-03-23 DIAGNOSIS — O09523 Supervision of elderly multigravida, third trimester: Secondary | ICD-10-CM | POA: Insufficient documentation

## 2022-03-23 DIAGNOSIS — Z3A31 31 weeks gestation of pregnancy: Secondary | ICD-10-CM

## 2022-03-23 DIAGNOSIS — O4700 False labor before 37 completed weeks of gestation, unspecified trimester: Secondary | ICD-10-CM

## 2022-03-23 DIAGNOSIS — O4703 False labor before 37 completed weeks of gestation, third trimester: Secondary | ICD-10-CM | POA: Insufficient documentation

## 2022-03-23 MED ORDER — TERBUTALINE SULFATE 1 MG/ML IJ SOLN
0.2500 mg | Freq: Once | INTRAMUSCULAR | Status: AC
  Administered 2022-03-23: 0.25 mg via SUBCUTANEOUS
  Filled 2022-03-23: qty 1

## 2022-03-23 MED ORDER — NIFEDIPINE 10 MG PO CAPS: 10.0000 mg | ORAL_CAPSULE | ORAL | 1 refills | Status: DC | PRN

## 2022-03-23 NOTE — MAU Note (Addendum)
..  Veronica Harris is a 36 y.o. at [redacted]w[redacted]d here in MAU reporting: was discharged this morning and states her contractions returned around 3 pm. Have been every 5 minutes.  Denies leaking of fluid, or vaginal bleeding.  +FM  Pain score: 8/10 Vitals:   03/23/22 2256  BP: 116/67  Pulse: 95  Resp: 15  Temp: 98.3 F (36.8 C)  SpO2: 100%     FHT:133 Lab orders placed from triage:  UA

## 2022-03-23 NOTE — Discharge Summary (Signed)
Postpartum Discharge Summary       Patient Name: Veronica Harris DOB: Sep 09, 1985 MRN: [id number]  Date of admission: 03/21/2022 Delivery date:This patient has no babies on file. Delivering provider: This patient has no babies on file. Date of discharge: 03/23/2022  Admitting diagnosis: Preterm labor in third trimester [O60.03] Intrauterine pregnancy: [redacted]w[redacted]d     Secondary diagnosis:  Principal Problem:   Preterm labor in third trimester      Discharge diagnosis:  Threatened preterm labor, undelivered                                                Hospital course: The patient was admitted in transfer from an outside hospital for preterm labor.  She received IV magnesium, p.o. indomethacin x 48hrs, betamethasone x2.  IV magnesium was discontinued and the patient remained stable for 24 hours.  She has had minimal uterine activity since then.  Fetal wellbeing has been reassuring throughout her hospitalization.  Magnesium Sulfate received: Yes: Neuroprotection BMZ received: Yes Rhophylac:N/A  Physical exam  Vitals:   03/22/22 1222 03/22/22 1651 03/23/22 0241 03/23/22 0756  BP: 106/60 (!) 103/48 (!) 95/57   Pulse: 94 83 88   Resp: 20 20 18 16   Temp: 98.1 F (36.7 C) 98 F (36.7 C) 97.8 F (36.6 C) 98 F (36.7 C)  TempSrc: Oral Oral Oral Oral  SpO2: 99% 99% 96% 100%  Weight:      Height:       General: alert, cooperative, and no distress FWB: NST reactive, cat 1 tracing Toco Quiet Labs: Lab Results  Component Value Date   WBC 18.5 (H) 03/21/2022   HGB 10.7 (L) 03/21/2022   HCT 31.6 (L) 03/21/2022   MCV 102.3 (H) 03/21/2022   PLT 242 03/21/2022       No data to display         Edinburgh Score:     No data to display            After visit meds:  Allergies as of 03/23/2022       Reactions   Latex         Medication List     STOP taking these medications    ALPRAZolam 0.5 MG tablet Commonly known as: XANAX   metroNIDAZOLE 500 MG tablet Commonly  known as: FLAGYL       TAKE these medications    buPROPion 300 MG 24 hr tablet Commonly known as: WELLBUTRIN XL Take 1 tablet by mouth daily.   escitalopram 20 MG tablet Commonly known as: LEXAPRO Take 20 mg by mouth daily.   NIFEdipine 10 MG capsule Commonly known as: PROCARDIA Take 1 capsule (10 mg total) by mouth every 4 (four) hours as needed.         Discharge home in stable condition Discharge home with p.o. nifedipine 10 mg every 4 hours as needed contractions.  Patient instructed in signs and symptoms to be aware of for emergent need to return to the hospital.  Patient will remain home on modified bedrest until her follow-up appointment.  Patient has a follow-up appointment on 9/12 with [facility].  Future Appointments:No future appointments. Follow up Visit:  Follow-up Information     [facility] Follow up.   Why: Keep your appointment for Tuesday 9/12 Contact information: [address]  [address] [place] [address] [phone]                     03/23/2022 Waynard Reeds, MD

## 2022-03-23 NOTE — MAU Provider Note (Signed)
History     CSN: [id number]  Arrival date and time: 03/23/22 2244   Event Date/Time   First Provider Initiated Contact with Patient 03/23/22 2324      Chief Complaint  Patient presents with   Contractions   HPI  Veronica Harris is a 36 y.o. G2P1001 at [redacted]w[redacted]d who presents for evaluation of contractions. Patient reports she was admitted on [facility] from  9/4 to this morning on 9/6 for threatened preterm labor. She was discharged home with a prescription for procardia. She reports at around 1400 she noticed the contractions were coming back. She reports she took procardia at 1800 and while it stopped the contractions, she reports she was very "loopy" and sleepy. She felt like she was so tired she couldn't talk or function well. She reports at the 4 hour mark those symptoms resolved but the contractions came back. She reports they are every 5-6 minutes and she rates them a 6/10.  She denies any vaginal bleeding, discharge, and leaking of fluid. Denies any constipation, diarrhea or any urinary complaints. Reports normal fetal movement.   OB History     Gravida  2   Para  1   Term  1   Preterm      AB      Living  1      SAB      IAB      Ectopic      Multiple      Live Births  1           Past Medical History:  Diagnosis Date   Medical history non-contributory     Past Surgical History:  Procedure Laterality Date   NO PAST SURGERIES     SEPTOPLASTY      History reviewed. No pertinent family history.  Social History   Tobacco Use   Smoking status: Never   Smokeless tobacco: Never  Vaping Use   Vaping Use: Never used  Substance Use Topics   Alcohol use: Never   Drug use: Never    Allergies:  Allergies  Allergen Reactions   Latex     No medications prior to admission.    Review of Systems  Constitutional: Negative.  Negative for fatigue and fever.  HENT: Negative.    Respiratory: Negative.  Negative for shortness of breath.   Cardiovascular:  Negative.  Negative for chest pain.  Gastrointestinal:  Positive for abdominal pain. Negative for constipation, diarrhea, nausea and vomiting.  Genitourinary: Negative.  Negative for dysuria, vaginal bleeding and vaginal discharge.  Neurological: Negative.  Negative for dizziness and headaches.   Physical Exam   Blood pressure 111/63, pulse 99, temperature 98.3 F (36.8 C), temperature source Oral, resp. rate 17, height 5\' 5"  (1.651 m), weight 63.3 kg, SpO2 99 %.  Patient Vitals for the past 24 hrs:  BP Temp Temp src Pulse Resp SpO2 Height Weight  03/24/22 0036 111/63 -- -- 99 17 -- -- --  03/24/22 0033 (!) 117/51 -- -- 96 -- -- -- --  03/24/22 0030 -- -- -- -- -- 99 % -- --  03/24/22 0025 -- -- -- -- -- 99 % -- --  03/24/22 0020 -- -- -- -- -- 99 % -- --  03/24/22 0015 -- -- -- -- -- 98 % -- --  03/24/22 0010 -- -- -- -- -- 99 % -- --  03/24/22 0005 -- -- -- -- -- 98 % -- --  03/24/22 0000 -- -- -- -- --  97 % -- --  03/23/22 2355 -- -- -- -- -- 98 % -- --  03/23/22 2350 -- -- -- -- -- 97 % -- --  03/23/22 2340 -- -- -- -- -- 98 % -- --  03/23/22 2336 119/69 -- -- 91 17 -- -- --  03/23/22 2256 116/67 98.3 F (36.8 C) Oral 95 15 100 % 5\' 5"  (1.651 m) 63.3 kg    Physical Exam Vitals and nursing note reviewed.  Constitutional:      General: She is not in acute distress.    Appearance: She is well-developed.  HENT:     Head: Normocephalic.  Eyes:     Pupils: Pupils are equal, round, and reactive to light.  Cardiovascular:     Rate and Rhythm: Normal rate and regular rhythm.     Heart sounds: Normal heart sounds.  Pulmonary:     Effort: Pulmonary effort is normal. No respiratory distress.     Breath sounds: Normal breath sounds.  Abdominal:     General: Bowel sounds are normal. There is no distension.     Palpations: Abdomen is soft.     Tenderness: There is no abdominal tenderness.  Skin:    General: Skin is warm and dry.  Neurological:     Mental Status: She is alert  and oriented to person, place, and time.  Psychiatric:        Mood and Affect: Mood normal.        Behavior: Behavior normal.        Thought Content: Thought content normal.        Judgment: Judgment normal.     Fetal Tracing:  Baseline: 130 Variability: moderate Accels: 15x15 Decels: none  Toco: 7-12  Dilation: 2 Effacement (%): 50 Cervical Position: Posterior Exam by:: [email or web address], CNM.   MAU Course  Procedures  MDM Prenatal records from community office reviewed. Pregnancy complicated by threatened preterm labor admission. Labs ordered and reviewed.   Cervix checked upon arrival and unchanged from previous exam. Options for management reviewed at length. Contractions not frequent but long and painful. Patient declines IV fluids, will PO hydrate  Terbutaline- resolved contractions Repeat cervical exam unchanged. Preterm labor precautions reviewed at length and patient to follow up in office tomorrow.   Assessment and Plan   1. Preterm contractions   2. [redacted] weeks gestation of pregnancy     -Discharge home in stable condition -Preterm labor precautions discussed -Patient advised to follow-up with OB  tomorrow for reassessment if still contracting -Patient may return to MAU as needed or if her condition were to change or worsen  Cleone Slim, CNM 03/24/2022, 2:01 AM

## 2022-03-24 DIAGNOSIS — O4700 False labor before 37 completed weeks of gestation, unspecified trimester: Secondary | ICD-10-CM

## 2022-03-24 DIAGNOSIS — Z3A31 31 weeks gestation of pregnancy: Secondary | ICD-10-CM

## 2022-03-24 NOTE — Discharge Instructions (Signed)

## 2022-04-22 LAB — OB RESULTS CONSOLE GBS: GBS: NEGATIVE

## 2022-04-24 ENCOUNTER — Inpatient Hospital Stay (HOSPITAL_COMMUNITY)
Admission: AD | Admit: 2022-04-24 | Discharge: 2022-04-24 | Disposition: A | Discharge status: Home / Self Care | Payer: BLUE CROSS/BLUE SHIELD | Attending: Obstetrics | Admitting: Obstetrics

## 2022-04-24 ENCOUNTER — Encounter (HOSPITAL_COMMUNITY): Payer: Self-pay | Admitting: Obstetrics

## 2022-04-24 DIAGNOSIS — O4703 False labor before 37 completed weeks of gestation, third trimester: Secondary | ICD-10-CM | POA: Insufficient documentation

## 2022-04-24 DIAGNOSIS — O4700 False labor before 37 completed weeks of gestation, unspecified trimester: Secondary | ICD-10-CM

## 2022-04-24 DIAGNOSIS — Z3A35 35 weeks gestation of pregnancy: Secondary | ICD-10-CM

## 2022-04-24 DIAGNOSIS — Z0371 Encounter for suspected problem with amniotic cavity and membrane ruled out: Secondary | ICD-10-CM

## 2022-04-24 LAB — COMPREHENSIVE METABOLIC PANEL
ALT: 11 U/L (ref 0–44)
AST: 19 U/L (ref 15–41)
Albumin: 3.2 g/dL — ABNORMAL LOW (ref 3.5–5.0)
Alkaline Phosphatase: 87 U/L (ref 38–126)
Anion gap: 11 (ref 5–15)
BUN: 5 mg/dL — ABNORMAL LOW (ref 6–20)
CO2: 19 mmol/L — ABNORMAL LOW (ref 22–32)
Calcium: 8.7 mg/dL — ABNORMAL LOW (ref 8.9–10.3)
Chloride: 105 mmol/L (ref 98–111)
Creatinine, Ser: 0.48 mg/dL (ref 0.44–1.00)
GFR, Estimated: >60 mL/min (ref >60)
Glucose, Bld: 80 mg/dL (ref 70–99)
Potassium: 3.5 mmol/L (ref 3.5–5.1)
Sodium: 135 mmol/L (ref 135–145)
Total Bilirubin: 0.5 mg/dL (ref 0.3–1.2)
Total Protein: 6.5 g/dL (ref 6.5–8.1)

## 2022-04-24 LAB — CBC
HCT: 33.4 % — ABNORMAL LOW (ref 36.0–46.0)
Hemoglobin: 11.7 g/dL — ABNORMAL LOW (ref 12.0–15.0)
MCH: 35 pg — ABNORMAL HIGH (ref 26.0–34.0)
MCHC: 35 g/dL (ref 30.0–36.0)
MCV: 100 fL (ref 80.0–100.0)
Platelets: 275 10*3/uL (ref 150–400)
RBC: 3.34 MIL/uL — ABNORMAL LOW (ref 3.87–5.11)
RDW: 12.2 % (ref 11.5–15.5)
WBC: 15.3 10*3/uL — ABNORMAL HIGH (ref 4.0–10.5)
nRBC: 0 % (ref 0.0–0.2)

## 2022-04-24 LAB — PROTEIN / CREATININE RATIO, URINE
Creatinine, Urine: 50 mg/dL
Protein Creatinine Ratio: 0.14 mg/mg{Cre} (ref 0.00–0.15)
Total Protein, Urine: 7 mg/dL

## 2022-04-24 LAB — AMNISURE RUPTURE OF MEMBRANE (ROM) NOT AT ARMC: Amnisure ROM: NEGATIVE

## 2022-04-24 MED ORDER — LACTATED RINGERS IV BOLUS
1000.0000 mL | Freq: Once | INTRAVENOUS | Status: AC
  Administered 2022-04-24: 1000 mL via INTRAVENOUS

## 2022-04-24 NOTE — MAU Note (Signed)
.  Veronica Harris is a 36 y.o. at [redacted]w[redacted]d here in MAU reporting: SROM at 0200 this morning. States contractions are now every minute. Denies VB.+FM. Last SVE 3.5.

## 2022-04-24 NOTE — Discharge Instructions (Signed)
Reasons to return to MAU at [facility]:  Since you are preterm, return to MAU if:  1.  Contractions are 10 minutes apart or less and they becoming more uncomfortable or painful over time 2.  You have a large gush of fluid, or a trickle of fluid that will not stop and you have to wear a pad 3.  You have bleeding that is bright red, heavier than spotting--like menstrual bleeding (spotting can be normal in early labor or after a check of your cervix) 4.  You do not feel the baby moving like he/she normally does  

## 2022-04-24 NOTE — MAU Provider Note (Signed)
Chief Complaint:  Contractions and Rupture of Membranes   Event Date/Time   First Provider Initiated Contact with Patient 04/24/22 1146      HPI: Veronica Harris is a 36 y.o. G2P1001 at [redacted]w[redacted]d who presents to maternity admissions reporting painful contractions every 1-2 minutes and a single gush of fluid at home that wet her sheets.  The fluid leaking occurred at 2 am this morning but she has not had any additional leaking. She was wearing a pad upon arrival in MAU but reports that there is no fluid on the pad.   She reports good fetal movement.  She denies any h/a, epigastric pain, or visual disturbances.   HPI  Past Medical History: Past Medical History:  Diagnosis Date   Medical history non-contributory     Past obstetric history: OB History  Gravida Para Term Preterm AB Living  2 1 1     1   SAB IAB Ectopic Multiple Live Births          1    # Outcome Date GA Lbr Len/2nd Weight Sex Delivery Anes PTL Lv  2 Current           1 Term             Past Surgical History: Past Surgical History:  Procedure Laterality Date   NO PAST SURGERIES     SEPTOPLASTY      Family History: History reviewed. No pertinent family history.  Social History: Social History   Tobacco Use   Smoking status: Never   Smokeless tobacco: Never  Vaping Use   Vaping Use: Never used  Substance Use Topics   Alcohol use: Never   Drug use: Never    Allergies:  Allergies  Allergen Reactions   Latex     Meds:  No medications prior to admission.    ROS:  Review of Systems  Constitutional:  Negative for chills, fatigue and fever.  Eyes:  Negative for visual disturbance.  Respiratory:  Negative for shortness of breath.   Cardiovascular:  Negative for chest pain.  Gastrointestinal:  Positive for abdominal pain. Negative for nausea and vomiting.  Genitourinary:  Positive for pelvic pain and vaginal discharge. Negative for difficulty urinating, dysuria, flank pain, vaginal bleeding and vaginal  pain.  Neurological:  Negative for dizziness and headaches.  Psychiatric/Behavioral: Negative.       I have reviewed patient's Past Medical Hx, Surgical Hx, Family Hx, Social Hx, medications and allergies.   Physical Exam  Patient Vitals for the past 24 hrs:  BP Temp Temp src Pulse Resp SpO2  04/24/22 1340 115/72 -- -- 88 -- --  04/24/22 1330 110/71 -- -- (!) 105 -- 99 %  04/24/22 1315 100/64 -- -- 100 -- 100 %  04/24/22 1301 109/69 -- -- 100 -- --  04/24/22 1248 118/72 -- -- 91 -- --  04/24/22 1215 115/75 -- -- 97 -- 99 %  04/24/22 1201 125/77 -- -- 97 -- --  04/24/22 1148 (!) 123/94 -- -- (!) 112 -- --  04/24/22 1138 (!) 141/102 98 F (36.7 C) Oral (!) 128 20 --   Constitutional: Well-developed, well-nourished female in moderate distress upon arrival.  Cardiovascular: normal rate Respiratory: normal effort GI: Abd soft, non-tender, gravid appropriate for gestational age.  MS: Extremities nontender, no edema, normal ROM Neurologic: Alert and oriented x 4.  GU: Neg CVAT.  PELVIC EXAM:   Dilation: 2.5 Effacement (%): 40 Station: -3 Exam by:: Fatima Blank, CNM  FHT:  Baseline 135 , moderate variability, accelerations present, no decelerations Contractions: 4-5 minutes, irritability in between, pt asked to relax her abdomen to better visualize contractions on toco   Labs: Results for orders placed or performed during the hospital encounter of 04/24/22 (from the past 24 hour(s))  Amnisure rupture of membrane (rom)not at [facility]     Status: None   Collection Time: 04/24/22 11:45 AM  Result Value Ref Range   Amnisure ROM NEGATIVE   CBC     Status: Abnormal   Collection Time: 04/24/22 11:45 AM  Result Value Ref Range   WBC 15.3 (H) 4.0 - 10.5 K/uL   RBC 3.34 (L) 3.87 - 5.11 MIL/uL   Hemoglobin 11.7 (L) 12.0 - 15.0 g/dL   HCT 96.7 (L) 89.3 - 81.0 %   MCV 100.0 80.0 - 100.0 fL   MCH 35.0 (H) 26.0 - 34.0 pg   MCHC 35.0 30.0 - 36.0 g/dL   RDW 17.5 10.2 - 58.5 %    Platelets 275 150 - 400 K/uL   nRBC 0.0 0.0 - 0.2 %  Comprehensive metabolic panel     Status: Abnormal   Collection Time: 04/24/22 11:45 AM  Result Value Ref Range   Sodium 135 135 - 145 mmol/L   Potassium 3.5 3.5 - 5.1 mmol/L   Chloride 105 98 - 111 mmol/L   CO2 19 (L) 22 - 32 mmol/L   Glucose, Bld 80 70 - 99 mg/dL   BUN 5 (L) 6 - 20 mg/dL   Creatinine, Ser 2.77 0.44 - 1.00 mg/dL   Calcium 8.7 (L) 8.9 - 10.3 mg/dL   Total Protein 6.5 6.5 - 8.1 g/dL   Albumin 3.2 (L) 3.5 - 5.0 g/dL   AST 19 15 - 41 U/L   ALT 11 0 - 44 U/L   Alkaline Phosphatase 87 38 - 126 U/L   Total Bilirubin 0.5 0.3 - 1.2 mg/dL   GFR, Estimated >82 >42 mL/min   Anion gap 11 5 - 15  Protein / creatinine ratio, urine     Status: None   Collection Time: 04/24/22 12:48 PM  Result Value Ref Range   Creatinine, Urine 50 mg/dL   Total Protein, Urine 7 mg/dL   Protein Creatinine Ratio 0.14 0.00 - 0.15 mg/mg[Cre]   --/--/O POS (09/04 1347)  Imaging:  No results found.  MAU Course/MDM: Orders Placed This Encounter  Procedures   Amnisure rupture of membrane (rom)not at [facility]   CBC   Comprehensive metabolic panel   Protein / creatinine ratio, urine   Insert peripheral IV   Discharge patient    Meds ordered this encounter  Medications   lactated ringers bolus 1,000 mL     NST reviewed and reactive Pt with improved symptoms after IV fluids/rest Ferning negative on slide taken from exam Cervix unchanged in 2+ hours in MAU D/C home with PTL precautions F/U in office as scheduled  Assessment: 1. Preterm contractions   2. [redacted] weeks gestation of pregnancy   3. Encounter for suspected premature rupture of amniotic membranes, with rupture of membranes not found   4. Preterm labor in third trimester without delivery     Plan: Discharge home Labor precautions and fetal kick counts  Follow-up Information     [facility] Follow up.   Why: As scheduled Contact information: [address]  [address] [place] [address] [phone]         [facility] Follow up.  Specialty: Obstetrics and Gynecology Why: As needed for emergencies Contact information: [address] [id number] Wilhemina Bonito [place] [address] [phone]               Allergies as of 04/24/2022       Reactions   Latex         Medication List    You have not been prescribed any medications.     Sharen Counter Certified Nurse-Midwife 04/24/2022 1:49 PM

## 2022-05-10 ENCOUNTER — Telehealth (HOSPITAL_COMMUNITY): Payer: Self-pay | Admitting: *Deleted

## 2022-05-10 NOTE — Telephone Encounter (Signed)
Preadmission screen  

## 2022-05-11 ENCOUNTER — Telehealth (HOSPITAL_COMMUNITY): Payer: Self-pay | Admitting: *Deleted

## 2022-05-11 NOTE — Telephone Encounter (Signed)
Preadmission screen  

## 2022-05-13 ENCOUNTER — Inpatient Hospital Stay (HOSPITAL_COMMUNITY): Payer: BLUE CROSS/BLUE SHIELD | Admitting: Anesthesiology

## 2022-05-13 ENCOUNTER — Other Ambulatory Visit: Payer: Self-pay

## 2022-05-13 ENCOUNTER — Encounter (HOSPITAL_COMMUNITY): Payer: Self-pay | Admitting: Obstetrics

## 2022-05-13 ENCOUNTER — Encounter (HOSPITAL_COMMUNITY): Payer: Self-pay | Admitting: *Deleted

## 2022-05-13 ENCOUNTER — Telehealth (HOSPITAL_COMMUNITY): Payer: Self-pay | Admitting: *Deleted

## 2022-05-13 ENCOUNTER — Inpatient Hospital Stay (HOSPITAL_COMMUNITY)
Admission: AD | Admit: 2022-05-13 | Discharge: 2022-05-15 | DRG: 807 | Disposition: A | Discharge status: Home / Self Care | Payer: BLUE CROSS/BLUE SHIELD | Attending: Obstetrics | Admitting: Obstetrics

## 2022-05-13 DIAGNOSIS — Z3A38 38 weeks gestation of pregnancy: Secondary | ICD-10-CM

## 2022-05-13 DIAGNOSIS — O26893 Other specified pregnancy related conditions, third trimester: Secondary | ICD-10-CM | POA: Diagnosis present

## 2022-05-13 LAB — TYPE AND SCREEN
ABO/RH(D): O POS
Antibody Screen: NEGATIVE

## 2022-05-13 LAB — CBC
HCT: 33.1 % — ABNORMAL LOW (ref 36.0–46.0)
Hemoglobin: 11.6 g/dL — ABNORMAL LOW (ref 12.0–15.0)
MCH: 35 pg — ABNORMAL HIGH (ref 26.0–34.0)
MCHC: 35 g/dL (ref 30.0–36.0)
MCV: 100 fL (ref 80.0–100.0)
Platelets: 257 10*3/uL (ref 150–400)
RBC: 3.31 MIL/uL — ABNORMAL LOW (ref 3.87–5.11)
RDW: 12.4 % (ref 11.5–15.5)
WBC: 16 10*3/uL — ABNORMAL HIGH (ref 4.0–10.5)
nRBC: 0 % (ref 0.0–0.2)

## 2022-05-13 MED ORDER — LACTATED RINGERS IV SOLN: 500.0000 mL | Freq: Once | INTRAVENOUS | Status: DC

## 2022-05-13 MED ORDER — FENTANYL-BUPIVACAINE-NACL 0.5-0.125-0.9 MG/250ML-% EP SOLN
12.0000 mL/h | EPIDURAL | Status: DC | PRN
  Filled 2022-05-13: qty 250

## 2022-05-13 MED ORDER — PHENYLEPHRINE 80 MCG/ML (10ML) SYRINGE FOR IV PUSH (FOR BLOOD PRESSURE SUPPORT): 80.0000 ug | PREFILLED_SYRINGE | INTRAVENOUS | Status: DC | PRN

## 2022-05-13 MED ORDER — TERBUTALINE SULFATE 1 MG/ML IJ SOLN: 0.2500 mg | Freq: Once | INTRAMUSCULAR | Status: DC | PRN

## 2022-05-13 MED ORDER — LIDOCAINE HCL (PF) 1 % IJ SOLN: 30.0000 mL | INTRAMUSCULAR | Status: DC | PRN

## 2022-05-13 MED ORDER — EPHEDRINE 5 MG/ML INJ: 10.0000 mg | INTRAVENOUS | Status: DC | PRN

## 2022-05-13 MED ORDER — ONDANSETRON HCL 4 MG/2ML IJ SOLN: 4.0000 mg | Freq: Four times a day (QID) | INTRAMUSCULAR | Status: DC | PRN

## 2022-05-13 MED ORDER — FENTANYL CITRATE (PF) 100 MCG/2ML IJ SOLN: 50.0000 ug | INTRAMUSCULAR | Status: DC | PRN

## 2022-05-13 MED ORDER — OXYCODONE-ACETAMINOPHEN 5-325 MG PO TABS: 2.0000 | ORAL_TABLET | ORAL | Status: DC | PRN

## 2022-05-13 MED ORDER — OXYTOCIN-SODIUM CHLORIDE 30-0.9 UT/500ML-% IV SOLN: 2.5000 [IU]/h | INTRAVENOUS | Status: DC

## 2022-05-13 MED ORDER — OXYCODONE-ACETAMINOPHEN 5-325 MG PO TABS: 1.0000 | ORAL_TABLET | ORAL | Status: DC | PRN

## 2022-05-13 MED ORDER — DIPHENHYDRAMINE HCL 50 MG/ML IJ SOLN: 12.5000 mg | INTRAMUSCULAR | Status: DC | PRN

## 2022-05-13 MED ORDER — OXYTOCIN-SODIUM CHLORIDE 30-0.9 UT/500ML-% IV SOLN
1.0000 m[IU]/min | INTRAVENOUS | Status: DC
  Administered 2022-05-13: 2 m[IU]/min via INTRAVENOUS
  Filled 2022-05-13: qty 500

## 2022-05-13 MED ORDER — SOD CITRATE-CITRIC ACID 500-334 MG/5ML PO SOLN: 30.0000 mL | ORAL | Status: DC | PRN

## 2022-05-13 MED ORDER — LACTATED RINGERS IV SOLN: 500.0000 mL | INTRAVENOUS | Status: DC | PRN

## 2022-05-13 MED ORDER — OXYTOCIN BOLUS FROM INFUSION
333.0000 mL | Freq: Once | INTRAVENOUS | Status: AC
  Administered 2022-05-14: 333 mL via INTRAVENOUS

## 2022-05-13 MED ORDER — LACTATED RINGERS IV SOLN: INTRAVENOUS | Status: DC

## 2022-05-13 MED ORDER — ACETAMINOPHEN 325 MG PO TABS: 650.0000 mg | ORAL_TABLET | ORAL | Status: DC | PRN

## 2022-05-13 NOTE — Anesthesia Preprocedure Evaluation (Signed)
Anesthesia Evaluation  Patient identified by MRN, date of birth, ID band Patient awake    Reviewed: Allergy & Precautions, Patient's Chart, lab work & pertinent test results  Airway Mallampati: II  TM Distance: >3 FB Neck ROM: Full    Dental no notable dental hx.    Pulmonary neg pulmonary ROS,    Pulmonary exam normal breath sounds clear to auscultation       Cardiovascular negative cardio ROS Normal cardiovascular exam Rhythm:Regular Rate:Normal     Neuro/Psych negative neurological ROS  negative psych ROS   GI/Hepatic negative GI ROS, Neg liver ROS,   Endo/Other  negative endocrine ROS  Renal/GU negative Renal ROS  negative genitourinary   Musculoskeletal negative musculoskeletal ROS (+)   Abdominal   Peds negative pediatric ROS (+)  Hematology negative hematology ROS (+) Hb 11.6, plt 257   Anesthesia Other Findings   Reproductive/Obstetrics (+) Pregnancy                             Anesthesia Physical Anesthesia Plan  ASA: 2  Anesthesia Plan: Epidural   Post-op Pain Management:    Induction:   PONV Risk Score and Plan: 2  Airway Management Planned: Natural Airway  Additional Equipment: None  Intra-op Plan:   Post-operative Plan:   Informed Consent: I have reviewed the patients History and Physical, chart, labs and discussed the procedure including the risks, benefits and alternatives for the proposed anesthesia with the patient or authorized representative who has indicated his/her understanding and acceptance.       Plan Discussed with:   Anesthesia Plan Comments:         Anesthesia Quick Evaluation

## 2022-05-13 NOTE — Telephone Encounter (Signed)
Preadmission screen  

## 2022-05-13 NOTE — H&P (Signed)
36 y.o. G2P1001 @ [redacted]w[redacted]d presents with contractions q2 minutes.  She was seen in the office for a routine prenatal visit today and was 4/60/-2.  In MAU she progressed to 5/80/-1.  Otherwise has good fetal movement and no bleeding.  Pregnancy complicated by: Advanced maternal age: low risk NIPT Admitted at 42 weeks for threatened preterm labor.  Received bmz course on 9/4 and 9/5  Past Medical History:  Diagnosis Date   Medical history non-contributory     Past Surgical History:  Procedure Laterality Date   NO PAST SURGERIES     SEPTOPLASTY      OB History  Gravida Para Term Preterm AB Living  2 1 1     1   SAB IAB Ectopic Multiple Live Births          1    # Outcome Date GA Lbr Len/2nd Weight Sex Delivery Anes PTL Lv  2 Current           1 Term 05/10/17     Vag-Spont   LIV    Social History   Socioeconomic History   Marital status: Married    Spouse name: Not on file   Number of children: Not on file   Years of education: Not on file   Highest education level: Not on file  Occupational History   Not on file  Tobacco Use   Smoking status: Never   Smokeless tobacco: Never  Vaping Use   Vaping Use: Never used  Substance and Sexual Activity   Alcohol use: Never   Drug use: Never   Sexual activity: Not Currently  Other Topics Concern   Not on file  Social History Narrative   Not on file   Social Determinants of Health   Financial Resource Strain: Not on file  Food Insecurity: No Food Insecurity (03/22/2022)   Hunger Vital Sign    Worried About Running Out of Food in the Last Year: Never true    Ran Out of Food in the Last Year: Never true  Transportation Needs: No Transportation Needs (03/22/2022)   PRAPARE - Hydrologist (Medical): No    Lack of Transportation (Non-Medical): No  Physical Activity: Not on file  Stress: Not on file  Social Connections: Not on file  Intimate Partner Violence: Not At Risk (03/22/2022)   Humiliation, Afraid,  Rape, and Kick questionnaire    Fear of Current or Ex-Partner: No    Emotionally Abused: No    Physically Abused: No    Sexually Abused: No   Latex    Prenatal Transfer Tool  Maternal Diabetes: No Genetic Screening: Normal Maternal Ultrasounds/Referrals: Normal Fetal Ultrasounds or other Referrals:  None Maternal Substance Abuse:  No Significant Maternal Medications:  None Significant Maternal Lab Results: Group B Strep negative  ABO, Rh: --/--/PENDING (10/27 1728) Antibody: PENDING (10/27 1728) Rubella: Immune (04/21 0000) RPR: Nonreactive (04/21 0000)  HBsAg: Negative (04/21 0000)  HIV: Non-reactive (04/21 0000)  GBS: Negative/-- (10/06 0000)    Vitals:   05/13/22 1710 05/13/22 1742  BP: 134/78 135/85  Pulse: (!) 101 84  Resp:    Temp:  98.1 F (36.7 C)  SpO2:       General:  NAD Abdomen:  soft, gravid Ex:  no edema SVE:  5/80/-1 per RN FHTs:  130s, moderate variability, category 1 Toco:  q2-3 minutes per pt report, not tracing on ball   A/P   36 y.o. G2P1001 [redacted]w[redacted]d presents with labor Admit  to L&D Epidural upon request GBS negative   Esteven Overfelt GEFFEL Chestine Spore

## 2022-05-13 NOTE — Progress Notes (Signed)
Patient seen and examined  BP 120/64   Pulse 90   Temp 98.1 F (36.7 C) (Oral)   Resp 19   Ht 5\' 4"  (1.626 m)   Wt 63.6 kg   SpO2 100%   BMI 24.08 kg/m   Toco: not tracing well EFM: 140s, moderate variability, category 1 SVE: 5-6/80/-1, AROM clear fluid  A/P: G2P1 @ [redacted]w[redacted]d with labor Pitocin as needed Anticipate SVD GBS negative

## 2022-05-13 NOTE — MAU Note (Signed)
.  Veronica Harris is a 36 y.o. at [redacted]w[redacted]d here in MAU reporting: she's having ctxs every 2 minutes, states ctxs began @ 1230 this afternoon.  Endorses +FM.  Denies VB or LOF.  Reports had PNV today, membranes swept.  VE @ office  4cms. LMP: NA Onset of complaint: 1230 Pain score: 5 Vitals:   05/13/22 1530  BP: 133/80  Pulse: (!) 111  Resp: 19  Temp: 97.6 F (36.4 C)  SpO2: 100%     FHT:143 bpm Lab orders placed from triage:   None

## 2022-05-14 ENCOUNTER — Encounter (HOSPITAL_COMMUNITY): Payer: Self-pay | Admitting: Obstetrics

## 2022-05-14 LAB — CBC
HCT: 29.1 % — ABNORMAL LOW (ref 36.0–46.0)
Hemoglobin: 10.1 g/dL — ABNORMAL LOW (ref 12.0–15.0)
MCH: 34.7 pg — ABNORMAL HIGH (ref 26.0–34.0)
MCHC: 34.7 g/dL (ref 30.0–36.0)
MCV: 100 fL (ref 80.0–100.0)
Platelets: 220 10*3/uL (ref 150–400)
RBC: 2.91 MIL/uL — ABNORMAL LOW (ref 3.87–5.11)
RDW: 12.4 % (ref 11.5–15.5)
WBC: 22.3 10*3/uL — ABNORMAL HIGH (ref 4.0–10.5)
nRBC: 0 % (ref 0.0–0.2)

## 2022-05-14 LAB — RPR: RPR Ser Ql: NONREACTIVE

## 2022-05-14 MED ORDER — SIMETHICONE 80 MG PO CHEW: 80.0000 mg | CHEWABLE_TABLET | ORAL | Status: DC | PRN

## 2022-05-14 MED ORDER — PRENATAL MULTIVITAMIN CH
1.0000 | ORAL_TABLET | Freq: Every day | ORAL | Status: DC
  Administered 2022-05-14 – 2022-05-15 (×2): 1 via ORAL
  Filled 2022-05-14 (×2): qty 1

## 2022-05-14 MED ORDER — ONDANSETRON HCL 4 MG PO TABS: 4.0000 mg | ORAL_TABLET | ORAL | Status: DC | PRN

## 2022-05-14 MED ORDER — WITCH HAZEL-GLYCERIN EX PADS: 1.0000 | MEDICATED_PAD | CUTANEOUS | Status: DC | PRN

## 2022-05-14 MED ORDER — DIBUCAINE (PERIANAL) 1 % EX OINT: 1.0000 | TOPICAL_OINTMENT | CUTANEOUS | Status: DC | PRN

## 2022-05-14 MED ORDER — BENZOCAINE-MENTHOL 20-0.5 % EX AERO: 1.0000 | INHALATION_SPRAY | CUTANEOUS | Status: DC | PRN

## 2022-05-14 MED ORDER — ACETAMINOPHEN 325 MG PO TABS
650.0000 mg | ORAL_TABLET | ORAL | Status: DC | PRN
  Administered 2022-05-14 (×2): 650 mg via ORAL
  Filled 2022-05-14 (×2): qty 2

## 2022-05-14 MED ORDER — OXYCODONE HCL 5 MG PO TABS: 5.0000 mg | ORAL_TABLET | ORAL | Status: DC | PRN

## 2022-05-14 MED ORDER — ONDANSETRON HCL 4 MG/2ML IJ SOLN: 4.0000 mg | INTRAMUSCULAR | Status: DC | PRN

## 2022-05-14 MED ORDER — COCONUT OIL OIL: 1.0000 | TOPICAL_OIL | Status: DC | PRN

## 2022-05-14 MED ORDER — IBUPROFEN 600 MG PO TABS
600.0000 mg | ORAL_TABLET | Freq: Four times a day (QID) | ORAL | Status: DC
  Administered 2022-05-14 – 2022-05-15 (×6): 600 mg via ORAL
  Filled 2022-05-14 (×6): qty 1

## 2022-05-14 MED ORDER — OXYCODONE HCL 5 MG PO TABS: 10.0000 mg | ORAL_TABLET | ORAL | Status: DC | PRN

## 2022-05-14 MED ORDER — DIPHENHYDRAMINE HCL 25 MG PO CAPS: 25.0000 mg | ORAL_CAPSULE | Freq: Four times a day (QID) | ORAL | Status: DC | PRN

## 2022-05-14 MED ORDER — SENNOSIDES-DOCUSATE SODIUM 8.6-50 MG PO TABS
2.0000 | ORAL_TABLET | ORAL | Status: DC
  Administered 2022-05-14 – 2022-05-15 (×2): 2 via ORAL
  Filled 2022-05-14 (×2): qty 2

## 2022-05-14 MED ORDER — TETANUS-DIPHTH-ACELL PERTUSSIS 5-2.5-18.5 LF-MCG/0.5 IM SUSY: 0.5000 mL | PREFILLED_SYRINGE | Freq: Once | INTRAMUSCULAR | Status: DC

## 2022-05-14 NOTE — Anesthesia Postprocedure Evaluation (Signed)
Anesthesia Post Note  Patient: Veronica Harris  Procedure(s) Performed: AN AD HOC LABOR EPIDURAL     Patient location during evaluation: Mother Baby Anesthesia Type: Epidural Level of consciousness: awake and alert and oriented Pain management: satisfactory to patient Vital Signs Assessment: post-procedure vital signs reviewed and stable Respiratory status: respiratory function stable Cardiovascular status: stable Postop Assessment: no headache, no backache, epidural receding, patient able to bend at knees, no signs of nausea or vomiting, adequate PO intake and able to ambulate Anesthetic complications: no   No notable events documented.  Last Vitals:  Vitals:   05/14/22 0400 05/14/22 0800  BP: 112/80 114/78  Pulse: 86 85  Resp: 18 18  Temp: 36.9 C 36.7 C  SpO2: 99% 98%    Last Pain:  Vitals:   05/14/22 0400  TempSrc: Oral  PainSc: 1    Pain Goal:                   Caila Cirelli

## 2022-05-14 NOTE — Progress Notes (Signed)
POSTPARTUM PROGRESS NOTE  Post Partum Day #0  Subjective:  No acute events overnight.  Pt denies problems with ambulating, voiding or po intake.  She denies nausea or vomiting.  Pain is well controlled.   Lochia Minimal.   Objective: Blood pressure 120/76, pulse 75, temperature 98 F (36.7 C), resp. rate 18, height 5\' 4"  (1.626 m), weight 63.6 kg, SpO2 98 %, unknown if currently breastfeeding.  Physical Exam:  General: alert, cooperative and no distress Lochia:normal flow Chest: CTAB Heart: RRR no m/r/g Abdomen: +BS, soft, nontender Uterine Fundus: firm, 3cm below umbilicus Extremities:  edema, neg calf TTP BL, neg Homans BL  Recent Labs    05/13/22 1727 05/14/22 0418  HGB 11.6* 10.1*  HCT 33.1* 29.1*    Assessment/Plan:  ASSESSMENT: Veronica Harris is a 36 y.o. [id number] s/p SVD @ [redacted]w[redacted]d. PNC c/b AMA, h/o PTL at 30wks.   Plan for discharge tomorrow and Breastfeeding   LOS: 1 day

## 2022-05-14 NOTE — Lactation Note (Signed)
This note was copied from a baby's chart. Lactation Consultation Note  Patient Name: Veronica Harris [id number] Date: 05/14/2022 Reason for consult: Initial assessment;Early term 37-38.6wks Age:37 hours   P2: Early term infant at 38+2 weeks Feeding preference: Breast/formula Weight loss: 0%  "Veronica Harris" was swaddled and asleep in the bassinet when I arrived.  Birth parent was happy to report that she has been latching well since delivery.  No voids/stools yet.  Encouraged to feed 8-12 times/24 hours or sooner if "Veronica Harris" shows cues.  Suggested she call her RN/LC for latch assistance as needed.  Breast feeding basics reviewed.  Support person present.   Maternal Data Has patient been taught Hand Expression?: Yes Does the patient have breastfeeding experience prior to this delivery?: Yes How long did the patient breastfeed?: 2 months  Feeding Mother's Current Feeding Choice: Breast Milk and Formula  LATCH Score                    Lactation Tools Discussed/Used    Interventions Interventions: Breast feeding basics reviewed;Education;LC Services brochure  Discharge Pump: Hands Free  Consult Status Consult Status: Follow-up Date: 05/15/22 Follow-up type: In-patient    Khamiyah Grefe R Jyssica Rief 05/14/2022, 8:28 AM

## 2022-05-14 NOTE — Lactation Note (Signed)
This note was copied from a baby's chart. Lactation Consultation Note  Patient Name: Veronica Harris [id number] Date: 05/14/2022   Age:37 hours L&D RN stated mom declined Lactation for now. She may want to see Lactation later.  Maternal Data    Feeding    LATCH Score                    Lactation Tools Discussed/Used    Interventions    Discharge    Consult Status      Theodoro Kalata 05/14/2022, 1:36 AM

## 2022-05-15 MED ORDER — IBUPROFEN 800 MG PO TABS: 800.0000 mg | ORAL_TABLET | Freq: Three times a day (TID) | ORAL | 1 refills | Status: AC | PRN

## 2022-05-15 NOTE — Lactation Note (Signed)
This note was copied from a baby's chart. Lactation Consultation Note  Patient Name: Veronica Harris [id number] Date: 05/15/2022 Reason for consult: Follow-up assessment Age:37 hours   P2: Early term infant at 38+2 weeks Feeding preference: Breast/formula Weight loss: 5%  "[name]" was swaddled and asleep in birth parent's arms when I arrived.  Birth parent had no questions/concerns related to breast feeding.  She feels like "[name]" has been feeding well; voiding/stooling.  Reviewed engorgement prevention/treatment since birth parent experienced this with her first child.  Family has our op phone number for any concerns after discharge.  First pediatric return visit is tomorrow.   Maternal Data    Feeding    LATCH Score                    Lactation Tools Discussed/Used    Interventions Interventions: Education  Discharge Discharge Education: Engorgement and breast care Pump: Personal  Consult Status Consult Status: Complete Date: 05/15/22 Follow-up type: Call as needed    Demetria Iwai R Lemont Sitzmann 05/15/2022, 11:31 AM

## 2022-05-15 NOTE — Progress Notes (Signed)
CSW received consult for hx of Postpartum Depression. CSW met with MOB to offer support and complete assessment.  When CSW entered room, MOB was observed holding infant "[name]." CSW introduced self and explained reason for consult. MOB was agreeable to consult.FOB entered room shortly after CSW with car seat. MOB provided verbal consent to complete consult with FOB present. MOB remained engaged throughout encounter.   CSW inquired about MOB's mental health history. MOB confirmed that she did experience postpartum depression after the birth of her daughter (born 05/10/17) about 6 months postpartum, marked by feelings of anger and irritability. MOB shared she didn't feel like herself. MOB reports she was prescribed wellbutrin and lexapro through her PCP which helped and MOB reports her symptoms of PPD resolved. MOB reports she stopped taking antidepressants with +UPT with infant. MOB reports she currently feels good and notes she felt "really good" during her pregnancy. MOB denies experiencing any recent PMADs symptoms. MOB denies additional mental health history. MOB reports she attended therapy in 2015. MOB declined additional mental health resources at this time, stating she feels comfortable discussing mental health concerns with her PCP or OBGYN if concerns arise. MOB identified FOB as a support. MOB denied current SI/HI.  MOB reports she has all needed items for infant, including a car seat and bassinet. MOB has chosen [facility] in [place], [place] for infant's follow up care. MOB declined additional resource needs.  CSW provided education regarding the baby blues period vs. perinatal mood disorders, discussed treatment and gave resources for mental health follow up if concerns arise.  CSW recommends self-evaluation during the postpartum time period using the New Mom Checklist from Postpartum Progress and encouraged MOB to contact a medical professional if symptoms are noted at any time.    MOB declined review of  Sudden Infant Death Syndrome (SIDS) precautions, stating she is aware of safe sleeping practices.  CSW identifies no further need for intervention and no barriers to discharge at this time.   Signed,  Berniece Salines, MSW, LCSWA, LCASA 05/15/2022 12:19 PM

## 2022-05-15 NOTE — Discharge Summary (Signed)
Postpartum Discharge Summary  Date of Service updated     Patient Name: Veronica Harris DOB: November 05, 1985 MRN: [id number]  Date of admission: 05/13/2022 Delivery date:05/14/2022  Delivering provider: Marlow Baars  Date of discharge: 05/15/2022  Admitting diagnosis: Normal labor [O80, Z37.9] Intrauterine pregnancy: [redacted]w[redacted]d     Secondary diagnosis:  Principal Problem:   Normal labor  Additional problems: h/o preterm labor    Discharge diagnosis: Term Pregnancy Delivered                                              Post partum procedures: none Augmentation: AROM Complications: None  Hospital course: Onset of Labor With Vaginal Delivery      36 y.o. yo [id number] at [redacted]w[redacted]d was admitted in Latent Labor on 05/13/2022. Labor course was complicated by none  Membrane Rupture Time/Date: 7:25 PM ,05/13/2022   Delivery Method:Vaginal, Spontaneous  Episiotomy: None  Lacerations:  None  Patient had a postpartum course complicated by none.  She is ambulating, tolerating a regular diet, passing flatus, and urinating well. Patient is discharged home in stable condition on 05/15/22.  Newborn Data: Birth date:05/14/2022  Birth time:12:44 AM  Gender:Female  Living status:Living  Apgars:8 ,9  Weight:3300 g    BMZ received: Yes 9/4-5 for preterm labor observation   Physical exam  Vitals:   05/14/22 1207 05/14/22 1455 05/14/22 2049 05/15/22 0645  BP: 113/75 120/76 118/79 116/76  Pulse: 80 75 74 73  Resp: 18 18 19 17   Temp: 98 F (36.7 C) 98 F (36.7 C) 98.2 F (36.8 C) 98 F (36.7 C)  TempSrc:   Oral Oral  SpO2:   99% 98%  Weight:      Height:       General: alert, cooperative, and no distress Lochia: appropriate Uterine Fundus: firm Incision: N/A DVT Evaluation: No evidence of DVT seen on physical exam. Negative Homan's sign. No cords or calf tenderness. Labs: Lab Results  Component Value Date   WBC 22.3 (H) 05/14/2022   HGB 10.1 (L) 05/14/2022   HCT 29.1 (L) 05/14/2022    MCV 100.0 05/14/2022   PLT 220 05/14/2022      Latest Ref Rng & Units 04/24/2022   11:45 AM  CMP  Glucose 70 - 99 mg/dL 80   BUN 6 - 20 mg/dL 5   Creatinine 06/24/2022 - 2.70 mg/dL 3.50   Sodium 0.93 - 818 mmol/L 135   Potassium 3.5 - 5.1 mmol/L 3.5   Chloride 98 - 111 mmol/L 105   CO2 22 - 32 mmol/L 19   Calcium 8.9 - 10.3 mg/dL 8.7   Total Protein 6.5 - 8.1 g/dL 6.5   Total Bilirubin 0.3 - 1.2 mg/dL 0.5   Alkaline Phos 38 - 126 U/L 87   AST 15 - 41 U/L 19   ALT 0 - 44 U/L 11    Edinburgh Score:    05/14/2022    2:55 AM  Edinburgh Postnatal Depression Scale Screening Tool  I have been able to laugh and see the funny side of things. 0  I have looked forward with enjoyment to things. 0  I have blamed myself unnecessarily when things went wrong. 1  I have been anxious or worried for no good reason. 0  I have felt scared or panicky for no good reason. 0  Things have been getting on  top of me. 0  I have been so unhappy that I have had difficulty sleeping. 0  I have felt sad or miserable. 0  I have been so unhappy that I have been crying. 0  The thought of harming myself has occurred to me. 0  Edinburgh Postnatal Depression Scale Total 1      After visit meds:  Allergies as of 05/15/2022       Reactions   Latex Rash        Medication List     TAKE these medications    ibuprofen 800 MG tablet Commonly known as: ADVIL Take 1 tablet (800 mg total) by mouth every 8 (eight) hours as needed.         Discharge home in stable condition Infant Feeding: Breast Infant Disposition:home with mother Discharge instruction: per After Visit Summary and Postpartum booklet. Activity: Advance as tolerated. Pelvic rest for 6 weeks.  Diet: routine diet Anticipated Birth Control: Unsure Postpartum Appointment:6 weeks Follow up Visit:[facility]   05/15/2022 Deliah Boston, MD

## 2022-05-15 NOTE — Progress Notes (Signed)
POSTPARTUM PROGRESS NOTE  Post Partum Day #2  Subjective:  No acute events overnight.  Pt denies problems with ambulating, voiding or po intake.  She denies nausea or vomiting.  Pain is well controlled.   Lochia Minimal.   Objective: Blood pressure 116/76, pulse 73, temperature 98 F (36.7 C), temperature source Oral, resp. rate 17, height 5\' 4"  (1.626 m), weight 63.6 kg, SpO2 98 %, unknown if currently breastfeeding.  Physical Exam:  General: alert, cooperative and no distress Lochia:normal flow Chest: CTAB Heart: RRR no m/r/g Abdomen: +BS, soft, nontender Uterine Fundus: firm, 3cm below umbilicus Extremities:  edema, neg calf TTP BL, neg Homans BL  Recent Labs    05/13/22 1727 05/14/22 0418  HGB 11.6* 10.1*  HCT 33.1* 29.1*     Assessment/Plan:  ASSESSMENT: Veronica Harris is a 36 y.o. [id number] s/p SVD @ [redacted]w[redacted]d. PNC c/b AMA, h/o PTL at 30wks.   Discharge home and Breastfeeding   LOS: 2 days

## 2022-05-19 ENCOUNTER — Inpatient Hospital Stay (HOSPITAL_COMMUNITY): Payer: BLUE CROSS/BLUE SHIELD

## 2022-05-19 ENCOUNTER — Inpatient Hospital Stay (HOSPITAL_COMMUNITY)
Admission: AD | Admit: 2022-05-19 | Payer: BLUE CROSS/BLUE SHIELD | Source: Home / Self Care | Admitting: Obstetrics and Gynecology

## 2022-05-25 ENCOUNTER — Telehealth (HOSPITAL_COMMUNITY): Payer: Self-pay | Admitting: *Deleted

## 2022-05-25 NOTE — Telephone Encounter (Signed)
Attempted hospital discharge follow-up call.Voicemail full. Deforest Hoyles, RN, 05/25/22, [phone]
